# Patient Record
Sex: Female | Born: 1965 | Race: White | Hispanic: No | Marital: Married | Smoking: Never smoker
Health system: Southern US, Community
[De-identification: ages and names within clinical notes are randomized; demographics above are authoritative.]

## PROBLEM LIST (undated history)

## (undated) DIAGNOSIS — C801 Malignant (primary) neoplasm, unspecified: Secondary | ICD-10-CM

## (undated) DIAGNOSIS — F32A Depression, unspecified: Secondary | ICD-10-CM

---

## 1998-06-16 ENCOUNTER — Other Ambulatory Visit: Admission: RE | Admit: 1998-06-16 | Discharge: 1998-06-16 | Payer: Self-pay | Admitting: Obstetrics and Gynecology

## 1999-06-18 ENCOUNTER — Other Ambulatory Visit: Admission: RE | Admit: 1999-06-18 | Discharge: 1999-06-18 | Payer: Self-pay | Admitting: Obstetrics and Gynecology

## 2000-07-12 ENCOUNTER — Other Ambulatory Visit: Admission: RE | Admit: 2000-07-12 | Discharge: 2000-07-12 | Payer: Self-pay | Admitting: Obstetrics and Gynecology

## 2001-12-21 ENCOUNTER — Other Ambulatory Visit: Admission: RE | Admit: 2001-12-21 | Discharge: 2001-12-21 | Payer: Self-pay | Admitting: Obstetrics and Gynecology

## 2014-05-27 ENCOUNTER — Other Ambulatory Visit: Payer: Self-pay | Admitting: Obstetrics and Gynecology

## 2014-05-27 DIAGNOSIS — R921 Mammographic calcification found on diagnostic imaging of breast: Secondary | ICD-10-CM

## 2014-05-29 ENCOUNTER — Encounter (INDEPENDENT_AMBULATORY_CARE_PROVIDER_SITE_OTHER): Payer: Self-pay

## 2014-05-29 ENCOUNTER — Ambulatory Visit
Admission: RE | Admit: 2014-05-29 | Discharge: 2014-05-29 | Disposition: A | Discharge status: Home / Self Care | Payer: PRIVATE HEALTH INSURANCE | Source: Ambulatory Visit | Attending: Obstetrics and Gynecology | Admitting: Obstetrics and Gynecology

## 2014-05-29 ENCOUNTER — Other Ambulatory Visit: Payer: Self-pay | Admitting: Obstetrics and Gynecology

## 2014-05-29 DIAGNOSIS — R921 Mammographic calcification found on diagnostic imaging of breast: Secondary | ICD-10-CM

## 2014-06-04 ENCOUNTER — Ambulatory Visit
Admission: RE | Admit: 2014-06-04 | Discharge: 2014-06-04 | Disposition: A | Discharge status: Home / Self Care | Payer: PRIVATE HEALTH INSURANCE | Source: Ambulatory Visit | Attending: Obstetrics and Gynecology | Admitting: Obstetrics and Gynecology

## 2014-06-04 DIAGNOSIS — R921 Mammographic calcification found on diagnostic imaging of breast: Secondary | ICD-10-CM

## 2015-12-02 ENCOUNTER — Other Ambulatory Visit: Payer: Self-pay | Admitting: Obstetrics and Gynecology

## 2015-12-02 DIAGNOSIS — R928 Other abnormal and inconclusive findings on diagnostic imaging of breast: Secondary | ICD-10-CM

## 2015-12-23 ENCOUNTER — Ambulatory Visit
Admission: RE | Admit: 2015-12-23 | Discharge: 2015-12-23 | Disposition: A | Discharge status: Home / Self Care | Payer: PRIVATE HEALTH INSURANCE | Source: Ambulatory Visit | Attending: Obstetrics and Gynecology | Admitting: Obstetrics and Gynecology

## 2015-12-23 ENCOUNTER — Other Ambulatory Visit: Payer: Self-pay | Admitting: Obstetrics and Gynecology

## 2015-12-23 DIAGNOSIS — R928 Other abnormal and inconclusive findings on diagnostic imaging of breast: Secondary | ICD-10-CM

## 2016-04-22 ENCOUNTER — Other Ambulatory Visit: Payer: Self-pay | Admitting: Nurse Practitioner

## 2016-04-22 DIAGNOSIS — N6310 Unspecified lump in the right breast, unspecified quadrant: Secondary | ICD-10-CM

## 2016-04-22 DIAGNOSIS — N6489 Other specified disorders of breast: Secondary | ICD-10-CM

## 2016-06-28 ENCOUNTER — Other Ambulatory Visit: Payer: PRIVATE HEALTH INSURANCE

## 2019-07-15 ENCOUNTER — Ambulatory Visit: Payer: Self-pay | Admitting: Obstetrics and Gynecology

## 2019-09-04 ENCOUNTER — Ambulatory Visit: Payer: Self-pay | Admitting: Obstetrics and Gynecology

## 2019-10-02 ENCOUNTER — Ambulatory Visit: Payer: Self-pay | Admitting: Obstetrics and Gynecology

## 2020-10-16 ENCOUNTER — Other Ambulatory Visit: Payer: Self-pay | Admitting: Family Medicine

## 2020-10-16 DIAGNOSIS — R9389 Abnormal findings on diagnostic imaging of other specified body structures: Secondary | ICD-10-CM

## 2020-10-19 ENCOUNTER — Other Ambulatory Visit: Payer: Self-pay

## 2020-10-19 ENCOUNTER — Ambulatory Visit
Admission: RE | Admit: 2020-10-19 | Discharge: 2020-10-19 | Disposition: A | Discharge status: Home / Self Care | Payer: PRIVATE HEALTH INSURANCE | Source: Ambulatory Visit | Attending: Family Medicine | Admitting: Family Medicine

## 2020-10-19 ENCOUNTER — Ambulatory Visit
Admission: RE | Admit: 2020-10-19 | Discharge: 2020-10-19 | Disposition: A | Discharge status: Home / Self Care | Payer: Commercial Managed Care - PPO | Source: Ambulatory Visit | Attending: Family Medicine | Admitting: Family Medicine

## 2020-10-19 ENCOUNTER — Ambulatory Visit: Admission: RE | Admit: 2020-10-19 | Payer: PRIVATE HEALTH INSURANCE | Source: Ambulatory Visit

## 2020-10-19 ENCOUNTER — Other Ambulatory Visit: Payer: Self-pay | Admitting: Family Medicine

## 2020-10-19 DIAGNOSIS — R9389 Abnormal findings on diagnostic imaging of other specified body structures: Secondary | ICD-10-CM

## 2020-10-19 MED ORDER — GADOBUTROL 1 MMOL/ML IV SOLN
5.0000 mL | Freq: Once | INTRAVENOUS | Status: AC | PRN
  Administered 2020-10-19: 5 mL via INTRAVENOUS

## 2020-10-26 ENCOUNTER — Other Ambulatory Visit: Payer: Self-pay

## 2020-10-26 ENCOUNTER — Ambulatory Visit: Payer: Commercial Managed Care - PPO | Attending: General Surgery

## 2020-10-26 DIAGNOSIS — R293 Abnormal posture: Secondary | ICD-10-CM | POA: Insufficient documentation

## 2020-10-26 DIAGNOSIS — D0512 Intraductal carcinoma in situ of left breast: Secondary | ICD-10-CM | POA: Insufficient documentation

## 2020-10-26 NOTE — Therapy (Signed)
[place], [place], [address] Phone: [phone]   Fax:  [phone]  Physical Therapy Evaluation  Patient Details  Name: Danielle Camacho MRN: [id number] Date of Birth: [birth year] Referring Provider (PT): Dr. Donne Hazel   Encounter Date: 10/26/2020   PT End of Session - 10/26/20 1715    Visit Number 1    Number of Visits 2    Date for PT Re-Evaluation 12/21/20    PT Start Time 0406    PT Stop Time 0453    PT Time Calculation (min) 47 min           No past medical history on file.   There were no vitals filed for this visit.    Subjective Assessment - 10/26/20 1708    Subjective Pt is here for pre-surgical screening.  She will see the plastic surgeon on Thursday and after that a surgical date will be set.  If she is told she needs a left mastectomy she will opt to have a bilateral mastectomy. She does not know yet if she will require radiation.    Patient is accompained by: Family member    Pertinent History No pertinent information in chart yet.  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 No/denies    Multiple Pain Sites No              [facility] PT Assessment - 10/26/20 0001      Assessment   Medical Diagnosis Intraductal Carcinoma in situ left breast    Referring Provider (PT) Dr. Donne Hazel    Onset Date/Surgical Date --   not known yet   Hand Dominance Right    Prior Therapy yes at [facility]      Precautions   Precaution Comments lymphedema risk      Restrictions   Weight Bearing Restrictions No      Balance Screen   Has the patient fallen in the past 6 months No    Has the patient had a decrease in activity level because of a fear of falling?  No    Is the patient reluctant to leave their home because of a fear of  falling?  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      Cognition   Overall Cognitive Status Within Functional Limits for tasks assessed      Observation/Other Assessments   Skin Integrity WNL      Sensation   Light Touch Appears Intact      Coordination   Gross Motor Movements are Fluid and Coordinated Yes    Fine Motor Movements are Fluid and Coordinated Yes      Posture/Postural Control   Posture/Postural Control Postural limitations    Postural Limitations Rounded Shoulders;Forward head      AROM   Right Shoulder Extension 76 Degrees    Right Shoulder Flexion 161 Degrees    Right Shoulder ABduction 180 Degrees    Right Shoulder Internal Rotation 74 Degrees  Right Shoulder External Rotation 102 Degrees    Left Shoulder Extension 70 Degrees    Left Shoulder Flexion 159 Degrees    Left Shoulder ABduction 180 Degrees    Left Shoulder Internal Rotation 90 Degrees    Left Shoulder External Rotation 100 Degrees             LYMPHEDEMA/ONCOLOGY QUESTIONNAIRE - 10/26/20 0001      Type   Cancer Type Left intraductal carcinoma in situ      Treatment   Active Chemotherapy Treatment No    Past Chemotherapy Treatment No    Active Radiation Treatment No    Past Radiation Treatment No    Current Hormone Treatment No    Past Hormone Therapy No      What other symptoms do you have   Are you having Pain No    Are you having pitting edema No    Is it Hard or Difficult finding clothes that fit No    Do you have infections No    Is there Decreased scar mobility No    Stemmer Sign No      Right Upper Extremity Lymphedema   15 cm Proximal to Olecranon Process 26.1 cm    10 cm Proximal to  Olecranon Process 26.2 cm    Olecranon Process 22.9 cm    15 cm Proximal to Ulnar Styloid Process 22.5 cm    10 cm Proximal to Ulnar Styloid Process 19 cm    Just Proximal to Ulnar Styloid Process 14.3 cm    Across Hand at [employer] 19.7 cm    At Force of 2nd Digit 5.5 cm      Left Upper Extremity Lymphedema   15 cm Proximal to Olecranon Process 25.5 cm    10 cm Proximal to Olecranon Process 25.3 cm    Olecranon Process 23 cm    15 cm Proximal to Ulnar Styloid Process 21.3 cm    10 cm Proximal to Ulnar Styloid Process 18.4 cm    Just Proximal to Ulnar Styloid Process 14.5 cm    Across Hand at [employer] 18 cm    At [place] of 2nd Digit 5.4 cm           L-DEX FLOWSHEETS - 10/26/20 1700      L-DEX LYMPHEDEMA SCREENING   Measurement Type Unilateral    L-DEX MEASUREMENT EXTREMITY Upper Extremity    POSITION  Standing    DOMINANT SIDE Right    At Risk Side Left    BASELINE SCORE (UNILATERAL) -1.6                Quick Dash - 10/26/20 0001    Open a tight or new jar No difficulty    Do heavy household chores (wash walls, wash floors) No difficulty    Carry a shopping bag or briefcase No difficulty    Wash your back No difficulty    Use a knife to cut food No difficulty    Recreational activities in which you take some force or impact through your arm, shoulder, or hand (golf, hammering, tennis) No difficulty    During the past week, to what extent has your arm, shoulder or hand problem interfered with your normal social activities with family, friends, neighbors, or groups? Not at all    During the past week, to what extent has your arm, shoulder or hand problem limited your work or other regular daily activities Not at all  Arm, shoulder, or hand pain. None    Tingling (pins and needles) in your arm, shoulder, or hand None    Difficulty Sleeping No difficulty    DASH Score 0 %            Objective measurements completed on examination: See above findings.                PT Education - 10/26/20 1713    Education Details Pt was educated in 4 post op exercises to initiate after drains are removed or as allowed by MD.  She was also instructed in free ABC class, and in continuing SOZO every 3 months after her surgery date for the next 2 years.  We discussed precautions for lymphedema/skin care.    Person(s) Educated Patient;Spouse    Methods Explanation;Demonstration;Handout    Comprehension Verbalized understanding;Returned demonstration               PT Long Term Goals - 10/26/20 1721      PT LONG TERM GOAL #1   Title Pts post surgical shoulder ROM will return to pre-surgical ROM and function    Time 8    Period Weeks    Status New    Target Date 12/21/20           Breast Clinic Goals - 10/26/20 1721      Patient will be able to verbalize understanding of pertinent lymphedema risk reduction practices relevant to her diagnosis specifically related to skin care.   Time 1    Period Days    Status Achieved    Target Date 10/26/20      Patient will be able to return demonstrate and/or verbalize understanding of the post-op home exercise program related to regaining shoulder range of motion.   Time 1    Period Days    Status Achieved    Target Date 10/26/20      Patient will be able to verbalize understanding of the importance of attending the postoperative After Breast Cancer Class for further lymphedema risk reduction education and therapeutic exercise.   Time 1    Period Days    Status Achieved    Target Date 10/26/20                 Plan - 10/26/20 1716    Clinical Impression Statement Pt. was seen today for pre-surgical screening.  Shoulder ROM, circumferential measurements and SOZO screen was performed.  Pt was given information on the ABC class and lymphedema.  She was advised to call when she gets her surgical date so we can arrange for follow up.    Stability/Clinical Decision Making  Stable/Uncomplicated    Clinical Decision Making Low    Rehab Potential Excellent    PT Frequency 1x / week    PT Duration 8 weeks    PT Next Visit Plan Reassessment after surgery to determine further physical therapy needs    PT Home Exercise Plan 4 post op exercises after drains removed and approval of MD    Recommended Other Services ABC class, SOZO screeens every 3 months after surgery for 2 years    Consulted and Agree with Plan of Care Patient;Family member/caregiver    Family Member Consulted husband         Patient will follow up at outpatient cancer rehab 3-4 weeks following surgery.  If the patient requires physical therapy at that time, a specific plan will be dictated and sent to the referring physician for  approval. The patient was educated today on appropriate basic range of motion exercises to begin post operatively and the importance of attending the After Breast Cancer class following surgery.  Patient was educated today on lymphedema risk reduction practices as it pertains to recommendations that will benefit the patient immediately following surgery.  She verbalized good understanding.   The patient was assessed using the L-Dex machine today to produce a lymphedema index baseline score. The patient will be reassessed on a regular basis (typically every 3 months) to obtain new L-Dex scores. If the score is > 6.5 points away from his/her baseline score indicating onset of subclinical lymphedema, it will be recommended to wear a compression garment for 4 weeks, 12 hours per day and then be reassessed. If the score continues to be > 6.5 points from baseline at reassessment, we will initiate lymphedema treatment. Assessing in this manner has a 95% rate of preventing clinically significant lymphedema.   Patient will benefit from skilled therapeutic intervention in order to improve the following deficits and impairments:  Decreased knowledge of precautions  Visit  Diagnosis: Intraductal carcinoma in situ of left breast  Abnormal posture     Problem List There are no problems to display for this patient.   Claris Pong 10/26/2020, 5:25 PM  [place], [place], [address] Phone: [phone]   Fax:  [phone]  Name: Rosalie Buenaventura MRN: [id number] Date of Birth: 1966-03-26 Cheral Almas, PT 10/26/20 5:29 PM

## 2020-10-26 NOTE — Patient Instructions (Signed)
Patient was instructed today in a home exercise program today for post op shoulder range of motion. These included active assist shoulder flexion in sitting/supine, scapular retraction, wall walking with shoulder abduction, and hands behind head external rotation in sitting/supine.  She was encouraged to do these twice a day, holding 3 seconds and repeating 5 times when permitted by her physician and after drains are removed.  Physical Therapy Information for After Breast Cancer Surgery/Treatment:   Lymphedema is a swelling condition that you may be at risk for in your arm if you have lymph nodes removed from the armpit area.  After a sentinel node biopsy, the risk is approximately 5-9% and is higher after an axillary node dissection.  There is treatment available for this condition and it is not life-threatening.  Contact your physician or physical therapist with concerns.  You may begin the 4 shoulder/posture exercises (see additional sheet) when permitted by your physician (typically a week after surgery).  If you have drains, you may need to wait until those are removed before beginning range of motion exercises.  A general recommendation is to not lift your arms above shoulder height until drains are removed.  These exercises should be done to your tolerance and gently.  This is not a "no pain/no gain" type of recovery so listen to your body and stretch into the range of motion that you can tolerate, stopping if you have pain.  If you are having immediate reconstruction, ask your plastic surgeon about doing exercises as he or she may want you to wait.  We encourage you to attend the free one time ABC (After Breast Cancer) class offered by [facility].  You will learn information related to lymphedema risk, prevention and treatment and additional exercises to regain mobility following surgery.  You can call [phone] for more information.  This is offered the 1st and 3rd  Monday of each month.  You only attend the class one time.  While undergoing any medical procedure or treatment, try to avoid blood pressure being taken or needle sticks from occurring on the arm on the side of cancer.   This recommendation begins after surgery and continues for the rest of your life.  This may help reduce your risk of getting lymphedema (swelling in your arm).  An excellent resource for those seeking information on lymphedema is the National Lymphedema Network's web site. It can be accessed at [place].org  If you notice swelling in your hand, arm or breast at any time following surgery (even if it is many years from now), please contact your doctor or physical therapist to discuss this.  Lymphedema can be treated at any time but it is easier for you if it is treated early on.  If you feel like your shoulder motion is not returning to normal in a reasonable amount of time, please contact your surgeon or physical therapist.  [facility]. [place], [place], [place] [phone]; [address] [place], [place] [address] ABC CLASS After Breast Cancer Class  After Breast Cancer Class is a specially designed exercise class to assist you in a safe recover after having breast cancer surgery.  In this class you will learn how to get back to full function whether your drains were just removed or if you had surgery a month ago.  This one-time class is held the 1st and 3rd Monday of every month from 11:00 a.m. until 12:00 noon at the [facility] located at [phone]  [place], [place] [address]  This class is FREE and space is limited. For more information or to register for the next available class, call [phone].  Class Goals   Understand specific stretches to improve the flexibility of you chest and shoulder.  Learn ways to safely strengthen your upper body and improve your posture.  Understand the warning signs of infection and why you may be at risk  for an arm infection.  Learn about Lymphedema and prevention.  ** You do not attend this class until after surgery.  Drains must be removed to participate  Patient was instructed today in a home exercise program today for post op shoulder range of motion. These included active assist shoulder flexion in sitting, scapular retraction, wall walking with shoulder abduction, and hands behind head external rotation.  She was encouraged to do these twice a day, holding 3 seconds and repeating 5 times when permitted by her physician.

## 2020-10-30 ENCOUNTER — Other Ambulatory Visit: Payer: Self-pay | Admitting: General Surgery

## 2020-10-30 DIAGNOSIS — D0512 Intraductal carcinoma in situ of left breast: Secondary | ICD-10-CM

## 2020-11-06 NOTE — H&P (Signed)
Subjective:     Patient ID: Danielle Camacho is a 55 y.o. female.  HPI  Here for follow up discussion breast reconstruction prior to planned bilateral mastectomies. Presented following screening MMG with left breast calcifications. Biopsy demonstrated high grade DCIS ER+/PR-. Calcifications span 7.9 cm. Axillary [place] negaitve  MRI RIGHT breast showed no abnormal LN, three areas of circumferential ring like enhancement within the RIGHT breast compatible with benign cystic changes.   Skin sparing mastectomy recommended on left. Patient desires bilateral mastectomies.  Genetics pending.  Current 34B. Wt stable   Works as Therapist, occupational in [place]. 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 Has two daughters.  Review of Systems     Objective:   Physical Exam Cardiovascular:     Rate and Rhythm: Normal rate and regular rhythm.     Heart sounds: Normal heart sounds.  Pulmonary:     Effort: Pulmonary effort is normal.     Breath sounds: Normal breath sounds.  Abdominal:     Comments: Minimal soft tissue for reconstruction  Skin:    Comments: Fitzpatrick 2     Breasts: no masses palpable, right>left volume Right grade 3 ptosis, left pseudoptosis SN to nipple R 23 L 21 cm BW R 11 L 14 cm Nipple to IMF R 7 L 5 cm     Assessment:     DCIS left breast    Plan:     Plan bilateral skin sparing mastectomies with immediate expander acellular dermis reconstruction.  Reviewedreconstructionwill be asensate and not stimulate. Reviewed risks mastectomy flap necrosis requiring additional surgery. She is not candidate for NSM over left, patient declines NSM over right. Discussed possible anchor type scar vs transverse chest scar.  Discussed use of acellular dermis in reconstruction, cadaveric source, incorporation over several weeks, risk that if has seroma or infection can act as additional nidus for infection if not incorporated.Reviewed this is an off label use of acellular  dermis.  Reviewedprepectoral vs sub pectoral reconstruction. Discussed with patient and benefit of this is no animation deformity, may be less pain. Risk may be more visible rippling over upper poles, greater need of ADM. Reviewed pre pectoral would require larger amount acellular dermis, more drains. Discussed any type reconstruction also risks long term displacement implant and visible rippling. If prepectoral counseled I would recommend she be comfortable with silicone implants as more options that have less rippling. Sheagrees to prepectoral placement.  Additional risks including but not limited to bleeding, seroma, hematoma, damage to adjacent structures, need for additional procedures, blood clots in legs or lungs reviewed.  Reviewed overnight stay, drains, dressings, and post op limitations  Discussed risk COVID infectionthrough this elective surgery. Patient will receive COVID testing prior to surgery. Discussed even if patient receivesa negative test result, the tests in some cases may fail to detect the virus or patient maycontract COVID after the test.COVID 19 infectionbefore/during/aftersurgery may result in lead to a higher chance of complication and death.  Patient reported that she had desire for saline implants as there was less risk of infection with these. Counseled no difference in infection or contracture risks. Reviewed silicone more common in reconstruction setting as options available for more stable shape and less rippling over saline. However silicone does require imaging surveillance for rupture.  Rx for [place] given. Drain teaching completed. Rx for robaxin Bactrim and oxycodone given.

## 2020-11-23 ENCOUNTER — Other Ambulatory Visit (HOSPITAL_COMMUNITY): Payer: Commercial Managed Care - PPO

## 2020-11-23 NOTE — Pre-Procedure Instructions (Addendum)
Surgical Instructions    Your procedure is scheduled on Thursday, April 7th.  Report to [facility] Main Entrance "A" at 5:30 A.M., then check in with the Admitting office.  Call this number if you have problems the morning of surgery:  [phone]   If you have any questions prior to your surgery date call [phone]: Open Monday-Friday 8am-4pm    Remember:  Do not eat after midnight the night before your surgery  You may drink clear liquids until 4:30 the morning of your surgery.   Clear liquids allowed are: Water, Non-Citrus Juices (without pulp), Carbonated Beverages, Clear Tea, Black Coffee Only, and Gatorade.  Patient Instructions  . The night before surgery:  o No food after midnight. ONLY clear liquids after midnight   . The day of surgery (if you do NOT have diabetes):  o Drink ONE (1) Pre-Surgery Clear Ensure by 4:30 am the morning of surgery   o This drink was given to you during your hospital  pre-op appointment visit. o Nothing else to drink after completing the  Pre-Surgery Clear Ensure.         If you have questions, please contact your surgeon's office.     Take these medicines AS NEEDED the morning of surgery with A SIP OF WATER  LORazepam (ATIVAN)  valACYclovir (VALTREX)   As of today, STOP taking any Aspirin (unless otherwise instructed by your surgeon) Aleve, Naproxen, Ibuprofen, Motrin, Advil, Goody's, BC's, all herbal medications, fish oil, and all vitamins.                     Do NOT Smoke (Tobacco/Vaping) or drink Alcohol 24 hours prior to your procedure.  If you use a CPAP at night, you may bring all equipment for your overnight stay.   Contacts, glasses, piercing's, hearing aid's, dentures or partials may not be worn into surgery, please bring cases for these belongings.    For patients admitted to the hospital, discharge time will be determined by your treatment team.   Patients discharged the day of surgery will not be allowed to drive  home, and someone needs to stay with them for 24 hours.    Special instructions:   [facility]- Preparing For Surgery  Before surgery, you can play an important role. Because skin is not sterile, your skin needs to be as free of germs as possible. You can reduce the number of germs on your skin by washing with CHG (chlorahexidine gluconate) Soap before surgery.  CHG is an antiseptic cleaner which kills germs and bonds with the skin to continue killing germs even after washing.    Oral Hygiene is also important to reduce your risk of infection.  Remember - BRUSH YOUR TEETH THE MORNING OF SURGERY WITH YOUR REGULAR TOOTHPASTE  Please do not use if you have an allergy to CHG or antibacterial soaps. If your skin becomes reddened/irritated stop using the CHG.  Do not shave (including legs and underarms) for at least 48 hours prior to first CHG shower. It is OK to shave your face.  Please follow these instructions carefully.   1. Shower the NIGHT BEFORE SURGERY and the MORNING OF SURGERY  2. If you chose to wash your hair, wash your hair first as usual with your normal shampoo.  3. After you shampoo, rinse your hair and body thoroughly to remove the shampoo.  4. Use CHG Soap as you would any other liquid soap. You can apply CHG directly to the skin  and wash gently with a scrungie or a clean washcloth.   5. Apply the CHG Soap to your body ONLY FROM THE NECK DOWN.  Do not use on open wounds or open sores. Avoid contact with your eyes, ears, mouth and genitals (private parts). Wash Face and genitals (private parts)  with your normal soap.   6. Wash thoroughly, paying special attention to the area where your surgery will be performed.  7. Thoroughly rinse your body with warm water from the neck down.  8. DO NOT shower/wash with your normal soap after using and rinsing off the CHG Soap.  9. Pat yourself dry with a CLEAN TOWEL.  10. Wear CLEAN PAJAMAS to bed the night before surgery  11. Place  CLEAN SHEETS on your bed the night before your surgery  12. DO NOT SLEEP WITH PETS.   Day of Surgery: Shower with CHG soap. Do not wear jewelry, make up, or nail polish Do not wear lotions, powders, perfumes, or deodorant. Do not shave 48 hours prior to surgery.  Do not bring valuables to the hospital. [facility] is not responsible for any belongings or valuables. Wear Clean/Comfortable clothing the morning of surgery Remember to brush your teeth WITH YOUR REGULAR TOOTHPASTE.   Please read over the following fact sheets that you were given.

## 2020-11-24 ENCOUNTER — Other Ambulatory Visit: Payer: Self-pay

## 2020-11-24 ENCOUNTER — Encounter (HOSPITAL_COMMUNITY): Payer: Self-pay

## 2020-11-24 ENCOUNTER — Encounter (HOSPITAL_COMMUNITY)
Admission: RE | Admit: 2020-11-24 | Discharge: 2020-11-24 | Disposition: A | Discharge status: Home / Self Care | Payer: Commercial Managed Care - PPO | Source: Ambulatory Visit | Attending: General Surgery | Admitting: General Surgery

## 2020-11-24 DIAGNOSIS — Z01812 Encounter for preprocedural laboratory examination: Secondary | ICD-10-CM | POA: Diagnosis present

## 2020-11-24 DIAGNOSIS — Z20822 Contact with and (suspected) exposure to covid-19: Secondary | ICD-10-CM | POA: Diagnosis not present

## 2020-11-24 DIAGNOSIS — D0512 Intraductal carcinoma in situ of left breast: Secondary | ICD-10-CM | POA: Diagnosis not present

## 2020-11-24 HISTORY — DX: Depression, unspecified: F32.A

## 2020-11-24 HISTORY — DX: Malignant (primary) neoplasm, unspecified: C80.1

## 2020-11-24 LAB — CBC
HCT: 42.8 % (ref 36.0–46.0)
Hemoglobin: 14.4 g/dL (ref 12.0–15.0)
MCH: 31.2 pg (ref 26.0–34.0)
MCHC: 33.6 g/dL (ref 30.0–36.0)
MCV: 92.6 fL (ref 80.0–100.0)
Platelets: 144 10*3/uL — ABNORMAL LOW (ref 150–400)
RBC: 4.62 MIL/uL (ref 3.87–5.11)
RDW: 12 % (ref 11.5–15.5)
WBC: 4.4 10*3/uL (ref 4.0–10.5)
nRBC: 0 % (ref 0.0–0.2)

## 2020-11-24 LAB — SARS CORONAVIRUS 2 (TAT 6-24 HRS): SARS Coronavirus 2: NEGATIVE

## 2020-11-24 NOTE — Progress Notes (Signed)
PCP - Dr. Kennith Maes Cardiologist - denies  Chest x-ray - n/a EKG - n/a Stress Test - denies ECHO - denies Cardiac Cath - denies  Sleep Study - denies CPAP - denies  Blood Thinner Instructions: n/a Aspirin Instructions:n/a  ERAS Protcol - pt to stop clear liquids by 0430 DOS. PRE-SURGERY Ensure or G2- Pt given (1) pre-surgical ensure to complete by 0430 DOS.  COVID TEST- 11/24/20; pt aware to quarantine after testing.  Requested clarification on consent with Dr. Cristal Generous office. Pt states that it should be bilateral skin sparing mastectomy and not just left skin sparing. Awaiting clarification from surgeon.   Anesthesia review: No  Patient denies shortness of breath, fever, cough and chest pain at PAT appointment   All instructions explained to the patient, with a verbal understanding of the material. Patient agrees to go over the instructions while at home for a better understanding. Patient also instructed to self quarantine after being tested for COVID-19. The opportunity to ask questions was provided.

## 2020-11-26 ENCOUNTER — Ambulatory Visit (HOSPITAL_COMMUNITY): Payer: Commercial Managed Care - PPO

## 2020-11-26 ENCOUNTER — Encounter (HOSPITAL_COMMUNITY)
Admission: RE | Admit: 2020-11-26 | Discharge: 2020-11-26 | Disposition: A | Discharge status: Home / Self Care | Payer: Commercial Managed Care - PPO | Source: Ambulatory Visit | Attending: General Surgery | Admitting: General Surgery

## 2020-11-26 ENCOUNTER — Encounter (HOSPITAL_COMMUNITY)
Admission: RE | Disposition: A | Discharge status: Home / Self Care | Payer: Self-pay | Source: Home / Self Care | Attending: General Surgery

## 2020-11-26 ENCOUNTER — Observation Stay (HOSPITAL_BASED_OUTPATIENT_CLINIC_OR_DEPARTMENT_OTHER)
Admission: RE | Admit: 2020-11-26 | Discharge: 2020-11-27 | Disposition: A | Discharge status: Home / Self Care | Payer: Commercial Managed Care - PPO | Attending: General Surgery | Admitting: General Surgery

## 2020-11-26 ENCOUNTER — Encounter (HOSPITAL_COMMUNITY): Payer: Self-pay | Admitting: General Surgery

## 2020-11-26 ENCOUNTER — Other Ambulatory Visit: Payer: Self-pay

## 2020-11-26 DIAGNOSIS — N6031 Fibrosclerosis of right breast: Secondary | ICD-10-CM | POA: Insufficient documentation

## 2020-11-26 DIAGNOSIS — D0512 Intraductal carcinoma in situ of left breast: Secondary | ICD-10-CM | POA: Diagnosis not present

## 2020-11-26 DIAGNOSIS — Z1231 Encounter for screening mammogram for malignant neoplasm of breast: Secondary | ICD-10-CM | POA: Diagnosis present

## 2020-11-26 DIAGNOSIS — C50911 Malignant neoplasm of unspecified site of right female breast: Secondary | ICD-10-CM | POA: Diagnosis present

## 2020-11-26 DIAGNOSIS — Z87891 Personal history of nicotine dependence: Secondary | ICD-10-CM | POA: Insufficient documentation

## 2020-11-26 HISTORY — PX: BREAST RECONSTRUCTION WITH PLACEMENT OF TISSUE EXPANDER AND ALLODERM: SHX6805

## 2020-11-26 HISTORY — PX: TOTAL MASTECTOMY: SHX6129

## 2020-11-26 HISTORY — PX: MASTECTOMY W/ SENTINEL NODE BIOPSY: SHX2001

## 2020-11-26 SURGERY — MASTECTOMY WITH SENTINEL LYMPH NODE BIOPSY
Anesthesia: General | Site: Breast | Laterality: Right

## 2020-11-26 MED ORDER — ORAL CARE MOUTH RINSE: 15.0000 mL | Freq: Once | OROMUCOSAL | Status: AC

## 2020-11-26 MED ORDER — PROPOFOL 10 MG/ML IV BOLUS
INTRAVENOUS | Status: AC
  Filled 2020-11-26: qty 40

## 2020-11-26 MED ORDER — ONDANSETRON HCL 4 MG/2ML IJ SOLN: 4.0000 mg | Freq: Four times a day (QID) | INTRAMUSCULAR | Status: DC | PRN

## 2020-11-26 MED ORDER — BUPIVACAINE HCL (PF) 0.25 % IJ SOLN
INTRAMUSCULAR | Status: AC
  Filled 2020-11-26: qty 30

## 2020-11-26 MED ORDER — MIDAZOLAM HCL 2 MG/2ML IJ SOLN
INTRAMUSCULAR | Status: AC
  Filled 2020-11-26: qty 2

## 2020-11-26 MED ORDER — PHENYLEPHRINE HCL-NACL 10-0.9 MG/250ML-% IV SOLN
INTRAVENOUS | Status: DC | PRN
  Administered 2020-11-26: 40 ug/min via INTRAVENOUS

## 2020-11-26 MED ORDER — ENSURE PRE-SURGERY PO LIQD: 296.0000 mL | Freq: Once | ORAL | Status: DC

## 2020-11-26 MED ORDER — 0.9 % SODIUM CHLORIDE (POUR BTL) OPTIME
TOPICAL | Status: DC | PRN
  Administered 2020-11-26: 2000 mL

## 2020-11-26 MED ORDER — LIDOCAINE 2% (20 MG/ML) 5 ML SYRINGE
INTRAMUSCULAR | Status: DC | PRN
  Administered 2020-11-26: 40 mg via INTRAVENOUS

## 2020-11-26 MED ORDER — KETOROLAC TROMETHAMINE 15 MG/ML IJ SOLN
15.0000 mg | INTRAMUSCULAR | Status: AC
  Administered 2020-11-26: 15 mg via INTRAVENOUS
  Filled 2020-11-26: qty 1

## 2020-11-26 MED ORDER — FENTANYL CITRATE (PF) 100 MCG/2ML IJ SOLN: 25.0000 ug | INTRAMUSCULAR | Status: DC | PRN

## 2020-11-26 MED ORDER — CEFAZOLIN SODIUM-DEXTROSE 2-4 GM/100ML-% IV SOLN
2.0000 g | Freq: Three times a day (TID) | INTRAVENOUS | Status: DC
  Administered 2020-11-26 – 2020-11-27 (×2): 2 g via INTRAVENOUS
  Filled 2020-11-26 (×3): qty 100

## 2020-11-26 MED ORDER — SUGAMMADEX SODIUM 200 MG/2ML IV SOLN
INTRAVENOUS | Status: DC | PRN
  Administered 2020-11-26: 200 mg via INTRAVENOUS

## 2020-11-26 MED ORDER — GLYCOPYRROLATE 0.2 MG/ML IJ SOLN
INTRAMUSCULAR | Status: DC | PRN
  Administered 2020-11-26 (×2): .1 mg via INTRAVENOUS

## 2020-11-26 MED ORDER — TECHNETIUM TC 99M TILMANOCEPT KIT
1.0000 | PACK | Freq: Once | INTRAVENOUS | Status: AC | PRN
  Administered 2020-11-26: 1 via INTRADERMAL

## 2020-11-26 MED ORDER — DEXAMETHASONE SODIUM PHOSPHATE 10 MG/ML IJ SOLN
INTRAMUSCULAR | Status: DC | PRN
  Administered 2020-11-26: 10 mg via INTRAVENOUS

## 2020-11-26 MED ORDER — SODIUM CHLORIDE 0.9 % IV SOLN
INTRAVENOUS | Status: DC | PRN
  Administered 2020-11-26: 1000 mL

## 2020-11-26 MED ORDER — SODIUM CHLORIDE (PF) 0.9 % IJ SOLN
INTRAMUSCULAR | Status: AC
  Filled 2020-11-26: qty 10

## 2020-11-26 MED ORDER — OXYCODONE HCL 5 MG/5ML PO SOLN: 5.0000 mg | Freq: Once | ORAL | Status: DC | PRN

## 2020-11-26 MED ORDER — METHOCARBAMOL 500 MG PO TABS
500.0000 mg | ORAL_TABLET | Freq: Four times a day (QID) | ORAL | Status: DC | PRN
  Administered 2020-11-26 – 2020-11-27 (×2): 500 mg via ORAL
  Filled 2020-11-26 (×2): qty 1

## 2020-11-26 MED ORDER — SODIUM CHLORIDE 0.9 % IV SOLN
INTRAVENOUS | Status: DC
  Filled 2020-11-26: qty 10

## 2020-11-26 MED ORDER — ONDANSETRON HCL 4 MG/2ML IJ SOLN
INTRAMUSCULAR | Status: DC | PRN
  Administered 2020-11-26: 4 mg via INTRAVENOUS

## 2020-11-26 MED ORDER — ESCITALOPRAM OXALATE 10 MG PO TABS
5.0000 mg | ORAL_TABLET | Freq: Every day | ORAL | Status: DC
  Administered 2020-11-26: 5 mg via ORAL
  Filled 2020-11-26: qty 1

## 2020-11-26 MED ORDER — LACTATED RINGERS IV SOLN: INTRAVENOUS | Status: DC

## 2020-11-26 MED ORDER — OXYCODONE HCL 5 MG PO TABS: 5.0000 mg | ORAL_TABLET | Freq: Once | ORAL | Status: DC | PRN

## 2020-11-26 MED ORDER — CHLORHEXIDINE GLUCONATE 0.12 % MT SOLN
15.0000 mL | Freq: Once | OROMUCOSAL | Status: AC
  Administered 2020-11-26: 15 mL via OROMUCOSAL
  Filled 2020-11-26: qty 15

## 2020-11-26 MED ORDER — FENTANYL CITRATE (PF) 250 MCG/5ML IJ SOLN
INTRAMUSCULAR | Status: AC
  Filled 2020-11-26: qty 5

## 2020-11-26 MED ORDER — ACETAMINOPHEN 500 MG PO TABS
1000.0000 mg | ORAL_TABLET | Freq: Four times a day (QID) | ORAL | Status: DC
  Administered 2020-11-26 – 2020-11-27 (×4): 1000 mg via ORAL
  Filled 2020-11-26 (×4): qty 2

## 2020-11-26 MED ORDER — ONDANSETRON HCL 4 MG/2ML IJ SOLN: 4.0000 mg | Freq: Once | INTRAMUSCULAR | Status: DC | PRN

## 2020-11-26 MED ORDER — OXYCODONE HCL 5 MG PO TABS
5.0000 mg | ORAL_TABLET | ORAL | Status: DC | PRN
Start: 2020-11-26 — End: 2020-11-27
  Administered 2020-11-26 – 2020-11-27 (×4): 10 mg via ORAL
  Filled 2020-11-26 (×4): qty 2

## 2020-11-26 MED ORDER — MIDAZOLAM HCL 5 MG/5ML IJ SOLN
INTRAMUSCULAR | Status: DC | PRN
  Administered 2020-11-26: 2 mg via INTRAVENOUS

## 2020-11-26 MED ORDER — SIMETHICONE 80 MG PO CHEW: 40.0000 mg | CHEWABLE_TABLET | Freq: Four times a day (QID) | ORAL | Status: DC | PRN

## 2020-11-26 MED ORDER — MORPHINE SULFATE (PF) 2 MG/ML IV SOLN
1.0000 mg | INTRAVENOUS | Status: DC | PRN
  Administered 2020-11-26: 1 mg via INTRAVENOUS
  Filled 2020-11-26: qty 1

## 2020-11-26 MED ORDER — DIPHENHYDRAMINE HCL 50 MG/ML IJ SOLN
INTRAMUSCULAR | Status: DC | PRN
  Administered 2020-11-26: 12.5 mg via INTRAVENOUS

## 2020-11-26 MED ORDER — PROPOFOL 10 MG/ML IV BOLUS
INTRAVENOUS | Status: DC | PRN
  Administered 2020-11-26: 150 mg via INTRAVENOUS

## 2020-11-26 MED ORDER — FENTANYL CITRATE (PF) 250 MCG/5ML IJ SOLN
INTRAMUSCULAR | Status: DC | PRN
  Administered 2020-11-26: 100 ug via INTRAVENOUS
  Administered 2020-11-26: 50 ug via INTRAVENOUS

## 2020-11-26 MED ORDER — ONDANSETRON 4 MG PO TBDP: 4.0000 mg | ORAL_TABLET | Freq: Four times a day (QID) | ORAL | Status: DC | PRN

## 2020-11-26 MED ORDER — SODIUM CHLORIDE 0.9 % IV SOLN: INTRAVENOUS | Status: DC

## 2020-11-26 MED ORDER — ACETAMINOPHEN 500 MG PO TABS
1000.0000 mg | ORAL_TABLET | ORAL | Status: AC
  Administered 2020-11-26: 1000 mg via ORAL
  Filled 2020-11-26: qty 2

## 2020-11-26 MED ORDER — METHYLENE BLUE 0.5 % INJ SOLN
INTRAVENOUS | Status: AC
  Filled 2020-11-26: qty 10

## 2020-11-26 MED ORDER — CEFAZOLIN SODIUM-DEXTROSE 2-4 GM/100ML-% IV SOLN
2.0000 g | INTRAVENOUS | Status: AC
  Administered 2020-11-26: 2 g via INTRAVENOUS
  Filled 2020-11-26: qty 100

## 2020-11-26 MED ORDER — LORAZEPAM 0.5 MG PO TABS: 0.5000 mg | ORAL_TABLET | Freq: Three times a day (TID) | ORAL | Status: DC | PRN

## 2020-11-26 MED ORDER — HEMOSTATIC AGENTS (NO CHARGE) OPTIME
TOPICAL | Status: DC | PRN
  Administered 2020-11-26: 1 via TOPICAL

## 2020-11-26 MED ORDER — ROCURONIUM BROMIDE 10 MG/ML (PF) SYRINGE
PREFILLED_SYRINGE | INTRAVENOUS | Status: DC | PRN
  Administered 2020-11-26: 60 mg via INTRAVENOUS
  Administered 2020-11-26: 30 mg via INTRAVENOUS
  Administered 2020-11-26: 40 mg via INTRAVENOUS

## 2020-11-26 SURGICAL SUPPLY — 107 items
ADH SKN CLS APL DERMABOND .7 (GAUZE/BANDAGES/DRESSINGS) ×3
ALLOGRAFT PERF 16X20 1.6+/-0.4 (Tissue) ×2 IMPLANT
APL PRP STRL LF DISP 70% ISPRP (MISCELLANEOUS) ×6
APL SKNCLS STERI-STRIP NONHPOA (GAUZE/BANDAGES/DRESSINGS)
APPLIER CLIP 11 MED OPEN (CLIP) ×4
APPLIER CLIP 9.375 MED OPEN (MISCELLANEOUS)
APR CLP MED 11 20 MLT OPN (CLIP) ×3
APR CLP MED 9.3 20 MLT OPN (MISCELLANEOUS)
BAG DECANTER FOR FLEXI CONT (MISCELLANEOUS) ×5 IMPLANT
BENZOIN TINCTURE PRP APPL 2/3 (GAUZE/BANDAGES/DRESSINGS) IMPLANT
BINDER BREAST LRG (GAUZE/BANDAGES/DRESSINGS) ×1 IMPLANT
BINDER BREAST MEDIUM (GAUZE/BANDAGES/DRESSINGS) IMPLANT
BINDER BREAST XLRG (GAUZE/BANDAGES/DRESSINGS) IMPLANT
BINDER BREAST XXLRG (GAUZE/BANDAGES/DRESSINGS) IMPLANT
BIOPATCH RED 1 DISK 7.0 (GAUZE/BANDAGES/DRESSINGS) IMPLANT
BLADE CLIPPER SURG (BLADE) IMPLANT
BLADE HEX COATED 2.75 (ELECTRODE) IMPLANT
BLADE SURG 10 STRL SS (BLADE) ×4 IMPLANT
BLADE SURG 15 STRL LF DISP TIS (BLADE) ×3 IMPLANT
BLADE SURG 15 STRL SS (BLADE) ×4
CANISTER SUCT 1200ML W/VALVE (MISCELLANEOUS) ×4 IMPLANT
CANISTER SUCT 3000ML PPV (MISCELLANEOUS) ×4 IMPLANT
CATH FOLEY 2WAY SLVR  5CC 14FR (CATHETERS) ×4
CATH FOLEY 2WAY SLVR 5CC 14FR (CATHETERS) IMPLANT
CHLORAPREP W/TINT 26 (MISCELLANEOUS) ×8 IMPLANT
CLIP APPLIE 11 MED OPEN (CLIP) IMPLANT
CLIP APPLIE 9.375 MED OPEN (MISCELLANEOUS) IMPLANT
CLIP VESOCCLUDE SM WIDE 6/CT (CLIP) IMPLANT
COVER BACK TABLE 60X90IN (DRAPES) ×4 IMPLANT
COVER MAYO STAND STRL (DRAPES) ×4 IMPLANT
COVER PROBE W GEL 5X96 (DRAPES) ×4 IMPLANT
COVER SURGICAL LIGHT HANDLE (MISCELLANEOUS) ×4 IMPLANT
COVER WAND RF STERILE (DRAPES) ×3 IMPLANT
DECANTER SPIKE VIAL GLASS SM (MISCELLANEOUS) IMPLANT
DERMABOND ADVANCED (GAUZE/BANDAGES/DRESSINGS) ×1
DERMABOND ADVANCED .7 DNX12 (GAUZE/BANDAGES/DRESSINGS) ×9 IMPLANT
DRAIN CHANNEL 15F RND FF W/TCR (WOUND CARE) ×2 IMPLANT
DRAIN CHANNEL 19F RND (DRAIN) ×5 IMPLANT
DRAPE HALF SHEET 40X57 (DRAPES) ×2 IMPLANT
DRAPE INCISE IOBAN 66X45 STRL (DRAPES) ×1 IMPLANT
DRAPE ORTHO SPLIT 77X108 STRL (DRAPES) ×12
DRAPE SURG ORHT 6 SPLT 77X108 (DRAPES) ×6 IMPLANT
DRAPE TOP ARMCOVERS (MISCELLANEOUS) ×4 IMPLANT
DRAPE U-SHAPE 76X120 STRL (DRAPES) ×3 IMPLANT
DRAPE UTILITY XL STRL (DRAPES) ×4 IMPLANT
DRAPE WARM FLUID 44X44 (DRAPES) ×4 IMPLANT
DRSG PAD ABDOMINAL 8X10 ST (GAUZE/BANDAGES/DRESSINGS) ×10 IMPLANT
DRSG TEGADERM 4X4.75 (GAUZE/BANDAGES/DRESSINGS) ×17 IMPLANT
ELECT BLADE 4.0 EZ CLEAN MEGAD (MISCELLANEOUS) ×4
ELECT CAUTERY BLADE 6.4 (BLADE) ×4 IMPLANT
ELECT COATED BLADE 2.86 ST (ELECTRODE) ×4 IMPLANT
ELECT REM PT RETURN 9FT ADLT (ELECTROSURGICAL) ×4
ELECTRODE BLDE 4.0 EZ CLN MEGD (MISCELLANEOUS) ×3 IMPLANT
ELECTRODE REM PT RTRN 9FT ADLT (ELECTROSURGICAL) ×6 IMPLANT
EVACUATOR SILICONE 100CC (DRAIN) ×7 IMPLANT
EXPANDER TISSUE FORTE 300CC (Breast) IMPLANT
GAUZE SPONGE 4X4 12PLY STRL LF (GAUZE/BANDAGES/DRESSINGS) IMPLANT
GLOVE BIO SURGEON STRL SZ 6 (GLOVE) ×12 IMPLANT
GLOVE SURG ENC MOIS LTX SZ7 (GLOVE) ×4 IMPLANT
GLOVE SURG UNDER POLY LF SZ7.5 (GLOVE) ×4 IMPLANT
GOWN STRL REUS W/ TWL LRG LVL3 (GOWN DISPOSABLE) ×15 IMPLANT
GOWN STRL REUS W/TWL LRG LVL3 (GOWN DISPOSABLE) ×20
HEMOSTAT ARISTA ABSORB 3G PWDR (HEMOSTASIS) ×1 IMPLANT
KIT BASIN OR (CUSTOM PROCEDURE TRAY) ×4 IMPLANT
KIT TURNOVER KIT B (KITS) ×4 IMPLANT
MARKER SKIN DUAL TIP RULER LAB (MISCELLANEOUS) ×4 IMPLANT
NDL HYPO 25X1 1.5 SAFETY (NEEDLE) IMPLANT
NDL SAFETY ECLIPSE 18X1.5 (NEEDLE) IMPLANT
NEEDLE HYPO 18GX1.5 SHARP (NEEDLE)
NEEDLE HYPO 25X1 1.5 SAFETY (NEEDLE) IMPLANT
NS IRRIG 1000ML POUR BTL (IV SOLUTION) ×12 IMPLANT
PACK BASIN DAY SURGERY FS (CUSTOM PROCEDURE TRAY) ×4 IMPLANT
PACK GENERAL/GYN (CUSTOM PROCEDURE TRAY) ×4 IMPLANT
PAD ARMBOARD 7.5X6 YLW CONV (MISCELLANEOUS) ×4 IMPLANT
PENCIL SMOKE EVACUATOR (MISCELLANEOUS) ×4 IMPLANT
PIN SAFETY STERILE (MISCELLANEOUS) ×8 IMPLANT
RETRACTOR ONETRAX LX 90X20 (MISCELLANEOUS) ×1 IMPLANT
SET ASEPTIC TRANSFER (MISCELLANEOUS) ×4 IMPLANT
SHEET MEDIUM DRAPE 40X70 STRL (DRAPES) IMPLANT
SLEEVE SCD COMPRESS KNEE MED (STOCKING) ×3 IMPLANT
SOL PREP POV-IOD 4OZ 10% (MISCELLANEOUS) ×4 IMPLANT
SPONGE LAP 18X18 RF (DISPOSABLE) ×6 IMPLANT
SPONGE LAP 4X18 RFD (DISPOSABLE) IMPLANT
STAPLER VISISTAT 35W (STAPLE) ×3 IMPLANT
STRIP CLOSURE SKIN 1/2X4 (GAUZE/BANDAGES/DRESSINGS) IMPLANT
SUT CHROMIC 4 0 PS 2 18 (SUTURE) ×5 IMPLANT
SUT ETHILON 2 0 FS 18 (SUTURE) ×5 IMPLANT
SUT MNCRL AB 4-0 PS2 18 (SUTURE) ×2 IMPLANT
SUT SILK 2 0 SH (SUTURE) ×2 IMPLANT
SUT VIC AB 0 CT2 27 (SUTURE) ×3 IMPLANT
SUT VIC AB 2-0 SH 27 (SUTURE) ×8
SUT VIC AB 2-0 SH 27XBRD (SUTURE) ×6 IMPLANT
SUT VIC AB 3-0 54X BRD REEL (SUTURE) IMPLANT
SUT VIC AB 3-0 BRD 54 (SUTURE)
SUT VIC AB 3-0 SH 27 (SUTURE) ×12
SUT VIC AB 3-0 SH 27X BRD (SUTURE) IMPLANT
SUT VICRYL 3-0 CR8 SH (SUTURE) ×4 IMPLANT
SUT VICRYL 4-0 PS2 18IN ABS (SUTURE) ×2 IMPLANT
SUT VLOC 180 0 24IN GS25 (SUTURE) ×2 IMPLANT
SYR BULB IRRIG 60ML STRL (SYRINGE) ×4 IMPLANT
SYR CONTROL 10ML LL (SYRINGE) IMPLANT
TISSUE EXPANDER FORTE 300CC (Breast) ×8 IMPLANT
TOWEL GREEN STERILE (TOWEL DISPOSABLE) ×4 IMPLANT
TOWEL GREEN STERILE FF (TOWEL DISPOSABLE) ×12 IMPLANT
TRAY FOLEY MTR SLVR 16FR STAT (SET/KITS/TRAYS/PACK) ×1 IMPLANT
TUBE CONNECTING 20X1/4 (TUBING) ×4 IMPLANT
YANKAUER SUCT BULB TIP NO VENT (SUCTIONS) ×3 IMPLANT

## 2020-11-26 NOTE — Anesthesia Procedure Notes (Signed)
Procedure Name: Intubation Date/Time: 11/26/2020 8:43 AM Performed by: Griffin Dakin, CRNA Pre-anesthesia Checklist: Patient identified, Emergency Drugs available, Suction available and Patient being monitored Patient Re-evaluated:Patient Re-evaluated prior to induction Oxygen Delivery Method: Circle system utilized Preoxygenation: Pre-oxygenation with 100% oxygen Induction Type: IV induction Ventilation: Mask ventilation without difficulty and Oral airway inserted - appropriate to patient size Laryngoscope Size: Mac and 3 Grade View: Grade I Tube type: Oral Tube size: 7.0 mm Number of attempts: 1 Airway Equipment and Method: Stylet and Oral airway Placement Confirmation: ETT inserted through vocal cords under direct vision,  positive ETCO2 and breath sounds checked- equal and bilateral Secured at: 21 cm Tube secured with: Tape Dental Injury: Teeth and Oropharynx as per pre-operative assessment

## 2020-11-26 NOTE — Op Note (Addendum)
Preoperative diagnosis: Stage 0 left breast cancer Postoperative diagnosis: saa Procedure: 1. Right risk reducing skin sparing mastectomy 2. Left skin sparing mastectomy 3. Left deep axillary sentinel node biopsy Surgeon: Dr Serita Grammes  Anesthesia: general with pectoral blocks EBL: 50 cc Drains per plastic surgery Complications none Specimens: 1. Right breast tissue marked short superior, long lateral 2. Left breast tissue marked short superior, long lateral 3. Left deep axillary sentinel nodes with highest count 695 Sponge and needle count dispo recovery stable  Indications: 32 yof with c density breasts. there were left breast calcs on the mm. there are loosely grouped calcs on the mm in the uoq. there are also some retroareolar calcs measuring 1.7 cm.  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 on post clip images she has 7.9x4 cm of calcs present. she has an mri that showed some areas on right side that were indeterminate and recommended for biopsy-these did not persist when she returned so no biopsy needed. we discussed bilateral mastectomies per her choice  Procedure:  After informed consent she underwent pectoral blocks and lymphoseek was injected in standard periareolar fashion.  She was given antibiotics and SCDs were in place. She was placed under general anesthesia She was prepped and draped in the standard sterile surgical fashion.  A surgical timeout was performed.   Incision lines were drawn by Dr Iran Planas. I did the right side first. I made an incision to include the nipple and areola. I then created flaps to the clavicle, parasternal region and latissimus.  I removed the breast and the pectoralis fascia.  I then marked as above. I obtained hemostasis. I packed this side and went to the other side  I then used a similar incision on the left side. I created similar flaps and removed the breast from the muscle.  I then marked this as above and passed  off the table.  I then identified the sentinel node in the axilla. I opened the axilla and identified the sentinel node or nodes. I removed these with highest count as above.  There was no background radioactivity.  I obtained hemostasis and closed the axilla with 3-0 vicryl.  I then turned the case over to plastic surgery for reconstruction

## 2020-11-26 NOTE — Interval H&P Note (Signed)
History and Physical Interval Note:  11/26/2020 6:55 AM  Danielle Camacho  has presented today for surgery, with the diagnosis of BREAST CANCER.  The various methods of treatment have been discussed with the patient and family. After consideration of risks, benefits and other options for treatment, the patient has consented to  Procedure(s): LEFT MASTECTOMY WITH LEFT AXILLARY SENTINEL LYMPH NODE BIOPSY (Left) RIGHT RISK REDUCING MASTECTOMY (Right) BILATERAL BREAST RECONSTRUCTION WITH PLACEMENT OF TISSUE EXPANDER AND ALLODERM (Bilateral) as a surgical intervention.  The patient's history has been reviewed, patient examined, no change in status, stable for surgery.  I have reviewed the patient's chart and labs.  Questions were answered to the patient's satisfaction.     Arnoldo Hooker [name]

## 2020-11-26 NOTE — Op Note (Signed)
Operative Note   DATE OF OPERATION: 4.7.22  LOCATION: [facility]  SURGICAL DIVISION: Plastic Surgery  PREOPERATIVE DIAGNOSES:  Left breast DCIS  POSTOPERATIVE DIAGNOSES:  same  PROCEDURE:  1. Bilateral breast reconstruction with tissue expanders 2. Acellular dermis (Alloderm) to bilateral chest 500c m2  SURGEON: Irene Limbo MD MBA  ASSISTANT: Gentry Fitz RNFA  ANESTHESIA:  General.   EBL: 300 ml for entire procedure  COMPLICATIONS: None immediate.   INDICATIONS FOR PROCEDURE:  The patient, Danielle Camacho, is a 55 y.o. female born on [birth year], is here for immediate prepectoral breast reconstruction following bilateral skin sparing mastectomies.    FINDINGS: Natrelle 133S FV-11-T 300 ml tissue expanders placed bilateral. RIGHT initial fill volume 140 ml air SN [id number] LEFT initial fill volume 80 ml air SN [id number]  DESCRIPTION OF PROCEDURE:  The patient was marked to mark sternal notch, chest midline, anterior axillary lines and inframammary folds. Patient was marked for mastectomy with triangular shaped incision with most superior portion nipple areola marked on breast meridian. Vertical limbs marked at lateral border areolae. The patient was taken to the operating room. SCDs were placed and IV antibiotics were given. Foley catheter placed. The patient's operative site was prepped and draped in a sterile fashion. A time out was performed and all information was confirmed to be correct. In supine position, the lateral limbs for resection marked and area over lower pole preserved as inferiorly based dermal pedicle. Skin de epithelialized in this area.Following completion of mastectomies, reconstruction began onrightside.  The cavity was irrigated with solution containingAncef, gentamicin, and Betadine. Hemostasis was ensured. A 19 Fr drain was placed in subcutaneous position laterally anda 15 Fr drain placed along inframammary fold. Eachsecured to skin with 2-0  nylon. The tissue expanderswere prepared on back table prior in insertion. The expander was filled with air.Perforated acellular dermis was draped over anterior surface expander. The ADM was then secured to itself over posterior surface of expanderwith 4-0 chromic. Redundant folds acellular dermis excised so that the ADM layflat without folds over air filled expander.The expander was secured to medial insertion pectoralis with a 0 vicryl.The superior and lateral tabs also secured to pectoralis muscle with 0-vicryl. The ADM was secured to pectoralis muscle and chest wall at inframammary foldwith 0Vlock suture.Laterally the mastectomy flap over posterior axillary line was advanced anteriorly and the subcutaneous tissue and superficial fascia was secured tochest wallwith 0-vicryl. The inferiorly based dermal pedicle was redraped superiorly over expander and acellular dermis and secured to pectoralis with interrupted 0-vicryl. Skin closure completedwith 3-0 vicryl in fascial layer and 4-0 vicryl in dermis. Skin closure completed with 4-0 monocryl subcuticular and tissue adhesive.  I then directed my attention toleftchest where similar irrigation and drain placement completed. The prepared expander with ADM secured over anterior surface was placed inleft chest and tabs secured to chest wall and pectoralis muscle with 0- vicryl suture. The acellular dermis at inframammary fold was secured to chest wall with 0 V-lock suture.Laterally the mastectomy flap over posterior axillary line was advanced anteriorly and the subcutaneous tissue and superficial fascia was secured tochest wallwith 0-vicryl. The inferiorly based dermal pedicle was redraped superiorly over expander and acellular dermis and secured to pectoralis with interrupted 0-vicryl. Skin closure completedwith 3-0 vicryl in fascial layer and 4-0 vicryl in dermis. Skin closure completed with 4-0 monocryl subcuticular and tissue  adhesive.Tegaderms applied bilateral, followed by dry dressing and breast binder.  The patient was allowed to wake from anesthesia, extubated and taken to the recovery  room in satisfactory condition.   SPECIMENS: none  DRAINS: 15 and 19 Fr drain in right and left breast reconstruction

## 2020-11-26 NOTE — H&P (Signed)
55 yof otherwise healthy, no family history of cancer presents after screening mm. she has prior left breast cyst aspirations and a negative stereo biopsy but nothing else. she has c density breasts. there were left breast calcs on the mm. there are loosely grouped calcs on the mm in the uoq. there are also some retroareolar calcs measuring 1.7 cm. right breast negative. 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 on post clip images she has 7.9x4 cm of calcs present. she has an mri that showed some areas on right side that were indeterminate and recommended for biopsy-these did not persist when she returned so no biopsy needed. lthe left side shows some skin thickening, no nodes abnormal. 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 she is here with her husband to discuss options   Past Surgical History  Breast Biopsy  Left. Gallbladder Surgery - Laparoscopic  Hysterectomy (not due to cancer) - Partial  Oral Surgery  Tonsillectomy   Diagnostic Studies History  Colonoscopy  1-5 years ago Mammogram  within last year Pap Smear  1-5 years ago  Allergies  No Known Drug Allergies   Medication History  valACYclovir HCl (1GM Tablet, Oral) Active. Anusol-HC (25MG  Suppository, Rectal) Active. Medications Reconciled  Social History  Alcohol use  Occasional alcohol use. Caffeine use  Coffee. No drug use  Tobacco use  Former smoker.  Family History  Alcohol Abuse  Father. Diabetes Mellitus  Father. Hypertension  Father, Mother, Sister. Thyroid problems  Mother.  Pregnancy / Birth History  Age at menarche  30 years. Age of menopause  9-50 Gravida  2 Length (months) of breastfeeding  3-6 Maternal age  60-25 Para  2 Regular periods    Review of Systems  General Not Present- Appetite Loss, Chills, Fatigue, Fever, Night Sweats, Weight Gain and Weight Loss. Skin Not Present- Change in  Wart/Mole, Dryness, Hives, Jaundice, New Lesions, Non-Healing Wounds, Rash and Ulcer. HEENT Present- Wears glasses/contact lenses. Not Present- Earache, Hearing Loss, Hoarseness, Nose Bleed, Oral Ulcers, Ringing in the Ears, Seasonal Allergies, Sinus Pain, Sore Throat, Visual Disturbances and Yellow Eyes. Respiratory Not Present- Bloody sputum, Chronic Cough, Difficulty Breathing, Snoring and Wheezing. Cardiovascular Not Present- Chest Pain, Difficulty Breathing Lying Down, Leg Cramps, Palpitations, Rapid Heart Rate, Shortness of Breath and Swelling of Extremities. Gastrointestinal Not Present- Abdominal Pain, Bloating, Bloody Stool, Change in Bowel Habits, Chronic diarrhea, Constipation, Difficulty Swallowing, Excessive gas, Gets full quickly at meals, Hemorrhoids, Indigestion, Nausea, Rectal Pain and Vomiting. Female Genitourinary Not Present- Frequency, Nocturia, Painful Urination, Pelvic Pain and Urgency. Musculoskeletal Not Present- Back Pain, Joint Pain, Joint Stiffness, Muscle Pain, Muscle Weakness and Swelling of Extremities. Neurological Not Present- Decreased Memory, Fainting, Headaches, Numbness, Seizures, Tingling, Tremor, Trouble walking and Weakness. Psychiatric Not Present- Anxiety, Bipolar, Change in Sleep Pattern, Depression, Fearful and Frequent crying. Endocrine Present- Hot flashes. Not Present- Cold Intolerance, Excessive Hunger, Hair Changes, Heat Intolerance and New Diabetes. Hematology Not Present- Blood Thinners, Easy Bruising, Excessive bleeding, Gland problems, HIV and Persistent Infections.   Physical Exam  General Mental Status-Alert. Orientation-Oriented X3. Breast Nipples-No Discharge. Breast Lump-No Palpable Breast Mass. Lymphatic Head & Neck General Head & Neck Lymphatics: Bilateral - Description - Normal. Axillary General Axillary Region: Bilateral - Description - Normal. 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 We discussed all of the different options for treatment for breast cancer including surgery, chemotherapy, radiation therapy, Herceptin, and antiestrogen therapy. she desired genetic testing today. we discussed insurance issues. i discussed pos, negative and VUS results. will send salivary testing today I think she needs ssm on the left side. I dont think nsm possible. I do not think she has anything concerning in her skin or certainly IBC as mentioned in MRI report. she is candidate for immediate recon and I will have her see plastic surgery. she desires risk reducing mastectomy on right. we discussed this has no benefit on survival and is not 100% preventive. it certainly would be more symmetric. discussed could take antiestrogens instead. she could have nsm or ssm on the right side and will decide after seeing plastic surgery. We discussed a sentinel lymph node biopsy as she does not appear to having lymph node involvement right now. We discussed the performance of that with injection of radioactive tracer. We discussed that there is a chance of having a positive node with a sentinel lymph node biopsy and we will await the permanent pathology to make any other first further decisions in terms of her treatment. We discussed up to a 5% risk lifetime of chronic shoulder pain as well as lymphedema associated with a sentinel lymph node biopsy. we discussed risk of invasive disease at excision as reason for sn biopsy and that any further treatment would be based on final pathology

## 2020-11-26 NOTE — Anesthesia Preprocedure Evaluation (Addendum)
Anesthesia Evaluation  Patient identified by MRN, date of birth, ID band Patient awake    Reviewed: Allergy & Precautions, NPO status , Patient's Chart, lab work & pertinent test results  Airway Mallampati: II  TM Distance: >3 FB Neck ROM: Full    Dental  (+) Teeth Intact, Dental Advisory Given   Pulmonary    breath sounds clear to auscultation       Cardiovascular  Rhythm:Regular Rate:Normal     Neuro/Psych    GI/Hepatic   Endo/Other    Renal/GU      Musculoskeletal   Abdominal   Peds  Hematology   Anesthesia Other Findings   Reproductive/Obstetrics                             Anesthesia Physical Anesthesia Plan  ASA: II  Anesthesia Plan: General   Post-op Pain Management:  Regional for Post-op pain   Induction: Intravenous  PONV Risk Score and Plan: Ondansetron and Dexamethasone  Airway Management Planned: Oral ETT  Additional Equipment:   Intra-op Plan:   Post-operative Plan: Extubation in OR  Informed Consent: I have reviewed the patients History and Physical, chart, labs and discussed the procedure including the risks, benefits and alternatives for the proposed anesthesia with the patient or authorized representative who has indicated his/her understanding and acceptance.     Dental advisory given  Plan Discussed with: CRNA and Anesthesiologist  Anesthesia Plan Comments: (Plan GA with oral ETT and bilateral pectoralis blocks.)       Anesthesia Quick Evaluation

## 2020-11-26 NOTE — Interval H&P Note (Signed)
History and Physical Interval Note:  11/26/2020 7:09 AM  Danielle Camacho  has presented today for surgery, with the diagnosis of BREAST CANCER.  The various methods of treatment have been discussed with the patient and family. After consideration of risks, benefits and other options for treatment, the patient has consented to  Procedure(s): LEFT MASTECTOMY WITH LEFT AXILLARY SENTINEL LYMPH NODE BIOPSY (Left) RIGHT RISK REDUCING MASTECTOMY (Right) BILATERAL BREAST RECONSTRUCTION WITH PLACEMENT OF TISSUE EXPANDER AND ALLODERM (Bilateral) as a surgical intervention.  The patient's history has been reviewed, patient examined, no change in status, stable for surgery.  I have reviewed the patient's chart and labs.  Questions were answered to the patient's satisfaction.     Rolm Bookbinder

## 2020-11-26 NOTE — Transfer of Care (Signed)
Immediate Anesthesia Transfer of Care Note  Patient: Danielle Camacho  Procedure(s) Performed: LEFT MASTECTOMY WITH LEFT AXILLARY SENTINEL LYMPH NODE BIOPSY (Left Breast) RIGHT RISK REDUCING MASTECTOMY (Right Breast) BILATERAL BREAST RECONSTRUCTION WITH PLACEMENT OF TISSUE EXPANDER AND ALLODERM (Bilateral Breast)  Patient Location: PACU  Anesthesia Type:General  Level of Consciousness: drowsy  Airway & Oxygen Therapy: Patient Spontanous Breathing and Patient connected to face mask oxygen  Post-op Assessment: Report given to RN and Post -op Vital signs reviewed and stable  Post vital signs: Reviewed and stable  Last Vitals:  Vitals Value Taken Time  BP 125/65 11/26/20 1111  Temp    Pulse 59 11/26/20 1115  Resp 15 11/26/20 1115  SpO2 100 % 11/26/20 1115  Vitals shown include unvalidated device data.  Last Pain:  Vitals:   11/26/20 0602  TempSrc:   PainSc: 0-No pain         Complications: No complications documented.

## 2020-11-26 NOTE — Progress Notes (Signed)
Danielle Camacho is a 55 y.o. female patient admitted. Awake, alert - oriented  X 4 - no acute distress noted.  VSS - Blood pressure 118/74, pulse 60, temperature 99.2 F (37.3 C), temperature source Oral, resp. rate 18, height 5\' 2"  (1.575 m), weight 51.4 kg, SpO2 99 %.    IV in place, occlusive dsg intact without redness.  Orientation to room, and floor completed.  Admission INP armband ID verified with patient/family, and in place.   SR up x 2, fall assessment complete, with patient and family able to verbalize understanding of risk associated with falls, and verbalized understanding to call nsg before up out of bed.  Call light within reach, patient able to voice, and demonstrate understanding. No evidence of skin break down noted on exam.  Admission nurse notified of admission.     Will cont to eval and treat per MD orders.  Cyndi Bender, RN 11/26/2020 12:49 PM

## 2020-11-26 NOTE — Anesthesia Postprocedure Evaluation (Signed)
Anesthesia Post Note  Patient: Danielle Camacho  Procedure(s) Performed: LEFT MASTECTOMY WITH LEFT AXILLARY SENTINEL LYMPH NODE BIOPSY (Left Breast) RIGHT RISK REDUCING MASTECTOMY (Right Breast) BILATERAL BREAST RECONSTRUCTION WITH PLACEMENT OF TISSUE EXPANDER AND ALLODERM (Bilateral Breast)     Patient location during evaluation: PACU Anesthesia Type: General and Regional Level of consciousness: awake Pain management: pain level controlled Vital Signs Assessment: post-procedure vital signs reviewed and stable Respiratory status: spontaneous breathing, nonlabored ventilation, respiratory function stable and patient connected to nasal cannula oxygen Cardiovascular status: blood pressure returned to baseline and stable Postop Assessment: no apparent nausea or vomiting Anesthetic complications: no   No complications documented.  Last Vitals:  Vitals:   11/26/20 1156 11/26/20 1232  BP: 127/76 118/74  Pulse: 62 60  Resp: 17 18  Temp: 36.4 C 37.3 C  SpO2: 100% 99%    Last Pain:  Vitals:   11/26/20 1245  TempSrc:   PainSc: 6                  Deniro Laymon P Malachai Schalk

## 2020-11-26 NOTE — Anesthesia Procedure Notes (Signed)
Anesthesia Regional Block: Pectoralis block   Pre-Anesthetic Checklist: ,, timeout performed, Correct Patient, Correct Site, Correct Laterality, Correct Procedure, Correct Position, site marked, Risks and benefits discussed,  Surgical consent,  Pre-op evaluation,  At surgeon's request and post-op pain management  Laterality: Left  Prep: chloraprep       Needles:  Injection technique: Single-shot  Needle Type: Echogenic Needle     Needle Length: 9cm  Needle Gauge: 21     Additional Needles:   Narrative:  Start time: 11/26/2020 7:05 AM End time: 11/26/2020 7:10 AM Injection made incrementally with aspirations every 5 mL.  Performed by: Personally  Anesthesiologist: Roberts Gaudy, MD  Additional Notes: 30 cc 0.25% Bupivacaine with 1:200 Epi 10 cc 1.3% Exparel

## 2020-11-26 NOTE — Discharge Instructions (Signed)
[facility] °[phone] ° °MASTECTOMY: POST OP INSTRUCTIONS °Take 400 mg of ibuprofen every 8 hours or 650 mg tylenol every 6 hours for next 72 hours then as needed. Use ice several times daily also. °Always review your discharge instruction sheet given to you by the facility where your surgery was performed. ° °IF YOU HAVE DISABILITY OR FAMILY LEAVE FORMS, YOU MUST BRING THEM TO THE OFFICE FOR PROCESSING.   °DO NOT GIVE THEM TO YOUR DOCTOR. °A prescription for pain medication may be given to you upon discharge.  Take your pain medication as prescribed, if needed.  If narcotic pain medicine is not needed, then you may take acetaminophen (Tylenol), naprosyn (Alleve) or ibuprofen (Advil) as needed. °1. Take your usually prescribed medications unless otherwise directed. °2. If you need a refill on your pain medication, please contact your pharmacy.  They will contact our office to request authorization.  Prescriptions will not be filled after 5pm or on week-ends. °3. You should follow a light diet the first few days after arrival home, such as soup and crackers, etc.  Resume your normal diet the day after surgery. °4. Most patients will experience some swelling and bruising on the chest and underarm.  Ice packs will help.  Swelling and bruising can take several days to resolve. Wear the binder day and night until you return to the office.  °5. It is common to experience some constipation if taking pain medication after surgery.  Increasing fluid intake and taking a stool softener (such as Colace) will usually help or prevent this problem from occurring.  A mild laxative (Milk of Magnesia or Miralax) should be taken according to package instructions if there are no bowel movements after 48 hours. °6. Unless discharge instructions indicate otherwise, leave your bandage dry and in place until your next appointment in 3-5 days.  You may take a limited sponge bath.  No tube baths or showers until the  drains are removed.  You may have steri-strips (small skin tapes) in place directly over the incision.  These strips should be left on the skin for 7-10 days. If you have glue it will come off in next couple week.  Any sutures will be removed at an office visit °7. DRAINS:  If you have drains in place, it is important to keep a list of the amount of drainage produced each day in your drains.  Before leaving the hospital, you should be instructed on drain care.  Call our office if you have any questions about your drains. I will remove your drains when they put out less than 30 cc or ml for 2 consecutive days. °8. ACTIVITIES:  You may resume regular (light) daily activities beginning the next day--such as daily self-care, walking, climbing stairs--gradually increasing activities as tolerated.  You may have sexual intercourse when it is comfortable.  Refrain from any heavy lifting or straining until approved by your doctor. °a. You may drive when you are no longer taking prescription pain medication, you can comfortably wear a seatbelt, and you can safely maneuver your car and apply brakes. °b. RETURN TO WORK:  __________________________________________________________ °9. You should see your doctor in the office for a follow-up appointment approximately 3-5 days after your surgery.  Your doctor’s nurse will typically make your follow-up appointment when she calls you with your pathology report.  Expect your pathology report 3-4business days after surgery. °10. OTHER INSTRUCTIONS: ______________________________________________________________________________________________ ____________________________________________________________________________________________ °WHEN TO CALL YOUR DR Midori Dado: °1. Fever over 101.0 °  2. Nausea and/or vomiting °3. Extreme swelling or bruising °4. Continued bleeding from incision. °5. Increased pain, redness, or drainage from the incision. °The clinic staff is available to answer your  questions during regular business hours.  Please don’t hesitate to call and ask to speak to one of the nurses for clinical concerns.  If you have a medical emergency, go to the nearest emergency room or call 911.  A surgeon from [facility] is always on call at the hospital. °[address], [address], [place], [place]  [address] ? [address] [address], [place], [place]   [address] °[phone] ? [phone] ? FAX [phone] °Web site: [email or web address] ° °

## 2020-11-26 NOTE — Anesthesia Procedure Notes (Signed)
Anesthesia Regional Block: Pectoralis block   Pre-Anesthetic Checklist: ,, timeout performed, Correct Patient, Correct Site, Correct Laterality, Correct Procedure, Correct Position, site marked, Risks and benefits discussed,  Surgical consent,  Pre-op evaluation,  At surgeon's request and post-op pain management  Laterality: Right  Prep: chloraprep       Needles:  Injection technique: Single-shot  Needle Type: Echogenic Needle     Needle Length: 9cm  Needle Gauge: 21     Additional Needles:   Procedures:,,,, ultrasound used (permanent image in chart),,,,  Narrative:  Start time: 11/26/2020 7:10 AM End time: 11/26/2020 7:15 AM Injection made incrementally with aspirations every 5 mL.  Performed by: Personally  Anesthesiologist: Roberts Gaudy, MD  Additional Notes: 30 cc 0.25% Bupivacaine with 1:200 Epi 10 cc 1.3% Exparel injected easily

## 2020-11-27 ENCOUNTER — Encounter (HOSPITAL_COMMUNITY): Payer: Self-pay | Admitting: General Surgery

## 2020-11-27 DIAGNOSIS — D0512 Intraductal carcinoma in situ of left breast: Secondary | ICD-10-CM

## 2020-11-27 NOTE — Plan of Care (Signed)
  Problem: Education: Goal: Knowledge of General Education information will improve Description: Including pain rating scale, medication(s)/side effects and non-pharmacologic comfort measures Outcome: Completed/Met   Problem: Health Behavior/Discharge Planning: Goal: Ability to manage health-related needs will improve Outcome: Completed/Met   Problem: Clinical Measurements: Goal: Ability to maintain clinical measurements within normal limits will improve Outcome: Completed/Met Goal: Will remain free from infection Outcome: Completed/Met Goal: Diagnostic test results will improve Outcome: Completed/Met Goal: Respiratory complications will improve Outcome: Completed/Met Goal: Cardiovascular complication will be avoided Outcome: Completed/Met   Problem: Nutrition: Goal: Adequate nutrition will be maintained Outcome: Completed/Met   Problem: Coping: Goal: Level of anxiety will decrease Outcome: Completed/Met   Problem: Elimination: Goal: Will not experience complications related to bowel motility Outcome: Completed/Met Goal: Will not experience complications related to urinary retention Outcome: Completed/Met   Problem: Pain Managment: Goal: General experience of comfort will improve Outcome: Completed/Met   Problem: Safety: Goal: Ability to remain free from injury will improve Outcome: Completed/Met   Problem: Skin Integrity: Goal: Risk for impaired skin integrity will decrease Outcome: Completed/Met   Problem: Education: Goal: Knowledge of disease or condition will improve Outcome: Completed/Met   Problem: Activity: Goal: Ability to maintain or regain function will improve Outcome: Completed/Met   Problem: Clinical Measurements: Goal: Postoperative complications will be avoided or minimized Outcome: Completed/Met   Problem: Self-Concept: Goal: Ability to verbalize positive feelings about self will improve Outcome: Completed/Met   Problem: Pain  Management: Goal: Expressions of feelings of enhanced comfort will increase Outcome: Completed/Met   Problem: Skin Integrity: Goal: Demonstration of wound healing without infection will improve Outcome: Completed/Met

## 2020-11-27 NOTE — Discharge Summary (Signed)
Physician Discharge Summary  Patient ID: Nancylee Gaines MRN: [id number] DOB/AGE: 12-31-1965 55 y.o.  Admit date: 11/26/2020 Discharge date: 11/27/2020  Admission Diagnoses: Left breast DCIS  Discharge Diagnoses:  Left breast DCIS  Discharged Condition: stable  Hospital Course: Post operatively patient did well with pain controlled tolerating diet and ambulatory without assist. Instructed on drain care and bathing.  Treatments: surgery: bilateral mastectomies left sentinel node bilateral breast reconstruction with tissue expanders acellular dermis 4.7.22  Discharge Exam: Blood pressure (!) 102/53, pulse 60, temperature 98 F (36.7 C), temperature source Oral, resp. rate 17, height 5\' 2"  (1.575 m), weight 51.4 kg, SpO2 100 %. Incision/Wound: chest soft bilateral developing ecchymoses without hematoma incisions dry intact drains serosanguinous  Disposition: Discharge disposition: 01-Home or Self Care       Discharge Instructions    Call MD for:  redness, tenderness, or signs of infection (pain, swelling, bleeding, redness, odor or green/yellow discharge around incision site)   Complete by: As directed    Call MD for:  temperature >100.5   Complete by: As directed    Discharge instructions   Complete by: As directed    Ok to remove dressings and shower am 4.9.22. Soap and water ok, pat Tegaderms dry. Do not remove Tegaderms. No creams or ointments over incisions. Do not let drains dangle in shower, attach to lanyard or similar.Strip and record drains twice daily and bring log to clinic visit.  Breast binder or soft compression bra all other times.  Ok to raise arms above shoulders for bathing and dressing.  No house yard work or exercise until cleared by MD.   Patient received all Rx preop. Recommend ibuprofen with meals to aid with pain control. Recommend Miralax or Dulcolax as needed for constipation. Also ok to use Tylenol for pain.   Driving Restrictions   Complete by: As  directed    No driving for 2 weeks then no driving if taking narcotics   Lifting restrictions   Complete by: As directed    No lifting > 5 lbs until cleared by MD   Resume previous diet   Complete by: As directed      Allergies as of 11/27/2020   No Known Allergies     Medication List    TAKE these medications   escitalopram 5 MG tablet Commonly known as: LEXAPRO Take 5 mg by mouth at bedtime.   LORazepam 0.5 MG tablet Commonly known as: ATIVAN Take 0.5 mg by mouth every 8 (eight) hours as needed for anxiety.   melatonin 5 MG Tabs Take 10 mg by mouth at bedtime as needed.   valACYclovir 500 MG tablet Commonly known as: VALTREX Take 500 mg by mouth 2 (two) times daily as needed (fever blisters).       Follow-up Information    Rolm Bookbinder, MD In 2 weeks.   Specialty: General Surgery Contact information: [address] [place] [address] [phone]        Irene Limbo, MD In 1 week.   Specialty: Plastic Surgery Why: as scheduled Contact information: [place] [address] [place] [address] [phone]               Signed: Irene Limbo 11/27/2020, 7:18 AM

## 2020-11-30 LAB — SURGICAL PATHOLOGY

## 2020-12-23 ENCOUNTER — Ambulatory Visit: Payer: Commercial Managed Care - PPO | Attending: General Surgery | Admitting: Physical Therapy

## 2020-12-23 ENCOUNTER — Other Ambulatory Visit: Payer: Self-pay

## 2020-12-23 DIAGNOSIS — Z483 Aftercare following surgery for neoplasm: Secondary | ICD-10-CM | POA: Diagnosis present

## 2020-12-23 DIAGNOSIS — M25611 Stiffness of right shoulder, not elsewhere classified: Secondary | ICD-10-CM | POA: Insufficient documentation

## 2020-12-23 DIAGNOSIS — D0512 Intraductal carcinoma in situ of left breast: Secondary | ICD-10-CM | POA: Diagnosis present

## 2020-12-23 DIAGNOSIS — R293 Abnormal posture: Secondary | ICD-10-CM | POA: Insufficient documentation

## 2020-12-23 DIAGNOSIS — M25612 Stiffness of left shoulder, not elsewhere classified: Secondary | ICD-10-CM | POA: Diagnosis present

## 2020-12-23 NOTE — Therapy (Signed)
Danielle Camacho, [place], [address] Phone: [phone]   Fax:  [phone]  Physical Therapy Evaluation  Patient Details  Name: Danielle Camacho MRN: [id number] Date of Birth: 1966-06-23 Referring Provider (PT): Thimmappa   Encounter Date: 12/23/2020   PT End of Session - 12/23/20 1450    Number of Visits 4    Date for PT Re-Evaluation 03/19/21           Past Medical History:  Diagnosis Date  . Cancer [facility])    DCIS-left side Breast Cancer  . Depression     Past Surgical History:  Procedure Laterality Date  . ABDOMINAL HYSTERECTOMY    . BREAST RECONSTRUCTION WITH PLACEMENT OF TISSUE EXPANDER AND ALLODERM Bilateral 11/26/2020   Procedure: BILATERAL BREAST RECONSTRUCTION WITH PLACEMENT OF TISSUE EXPANDER AND ALLODERM;  Surgeon: Irene Limbo, MD;  Location: [facility];  Service: Plastics;  Laterality: Bilateral;  . CHOLECYSTECTOMY    . MASTECTOMY W/ SENTINEL NODE BIOPSY Left 11/26/2020   Procedure: LEFT MASTECTOMY WITH LEFT AXILLARY SENTINEL LYMPH NODE BIOPSY;  Surgeon: Rolm Bookbinder, MD;  Location: [facility];  Service: General;  Laterality: Left;  . TONSILLECTOMY    . TOTAL MASTECTOMY Right 11/26/2020   Procedure: RIGHT RISK REDUCING MASTECTOMY;  Surgeon: Rolm Bookbinder, MD;  Location: [facility];  Service: General;  Laterality: Right;    There were no vitals filed for this visit.    Subjective Assessment - 12/23/20 1257    Subjective Pt says she is doing well after surgery.  She has started back to walking she has been doing exercises for her shoulders on her own. She plans to talk to Dr. Iran Planas tommorrow about implant surgery.  She says that she is abl to lay flat but it takes a while for her to relax She says she starts back to work on Monday light duty    Pertinent History No pertinent information in chart yet.  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 No/denies              [facility] PT Assessment - 12/23/20 0001      Assessment   Medical Diagnosis Intraductal Carcinoma in situ left breast    Referring Provider (PT) Thimmappa    Onset Date/Surgical Date 11/26/20      Prior Function   Level of Independence Independent      Observation/Other Assessments   Observations Pt is healing from bilateal mastectomies with expanders in place She appears well.  She says she has to move slowly but is able to lie flat on her back after a few minutes She is wearing a bra that fits well and reports her incisions are healing well. She has one guitar string cord in left axilla that she says has been improving.      ROM / Strength   AROM / PROM / Strength AROM      AROM   Right Shoulder Flexion 152 Degrees    Right Shoulder ABduction 180 Degrees    Left Shoulder Flexion 130 Degrees    Left Shoulder ABduction 148 Degrees  Objective measurements completed on examination: See above findings.       [facility] PT Treatment/Exercise - 12/23/20 0001      Self-Care   Self-Care Other Self-Care Comments    Other Self-Care Comments  issued Tg soft to see if it would help with cording, explained to pt to discard it at any sign of discomfort. Talked to patient about getting a compression sleeve if needed for heavy arm work or lifting at work, but she likely will wait until SOZO measurement in July to see if it is needed encouraged pt to sign up for ABC class and gave link for [email or web address] education and reassured her that her risk for lymphedema is low.      Exercises   Exercises Shoulder;Other Exercises    Other Exercises  pt is an experienced exerciser and is anxious to get back to  exercise.  issued Strength after Breast Cancer program and explained to start our very low weight  using no weight to do the motions, or use a water bottle and progress slowly from there. Cautioned against doing planks or any body weight exercises for now. She was encouraged to keep stretching      Shoulder Exercises: Supine   Protraction AROM;Both;5 reps   with dowel to limits   Flexion AAROM;Both;5 reps   with dowel   Flexion Limitations deep breath at the top, encouraged to take it to her limits but not to push too hard    ABduction AAROM;Both;5 reps   with dowel                 PT Education - 12/23/20 1438    Education Details how to progress with Strength After Breast Cancer exercise program ( pt very familiar with exercising) to start low and progress slowly paying attention to her body's response to exericse.    Person(s) Educated Patient    Methods Explanation;Handout    Comprehension Verbalized understanding               PT Long Term Goals - 12/23/20 1447      PT LONG TERM GOAL #1   Title Pts post surgical shoulder ROM will return to pre-surgical ROM and function    Time 8    Period Weeks    Status On-going                  Plan - 12/23/20 1440    Clinical Impression Statement Pt comes back to PT after bilateral mastectomy. 3 sentinel nodes on left, and immediate expanders. She is doing well, but still as limitation in endranges of ROM of both shoulders. Upgraded to dowel stretching and instructed in strength program to start very low and progress slowly Pt is experienced in exercise and is anxious to return, but she was cautioned not to return aggressively due to risk of seroma. She was encouraged to continue gentle stretching. She feels that she can continue to exercise on her own at home. She will come back for SOZO screen in July to monitor lymphedema and need for compression sleeve. She will call if she has questions with exericse and wants to come for  PT treatement.    Personal Factors and Comorbidities Comorbidity 2    Comorbidities mastectomy, sentinel node removal on left    Examination-Activity Limitations Reach Overhead    Examination-Participation Restrictions Occupation    Stability/Clinical Decision Making Stable/Uncomplicated    Clinical Decision Making Low    Rehab Potential Excellent  PT Next Visit Plan continue monitor for SOZO,schedule  more PT if pt reprots limited shoulder ROM at that time    Consulted and Agree with Plan of Care Patient           Patient will benefit from skilled therapeutic intervention in order to improve the following deficits and impairments:  Decreased range of motion,Impaired UE functional use,Impaired flexibility,Decreased strength  Visit Diagnosis: Abnormal posture - Plan: PT plan of care cert/re-cert  Intraductal carcinoma in situ of left breast - Plan: PT plan of care cert/re-cert  Stiffness of right shoulder, not elsewhere classified - Plan: PT plan of care cert/re-cert  Stiffness of left shoulder, not elsewhere classified - Plan: PT plan of care cert/re-cert  Aftercare following surgery for neoplasm - Plan: PT plan of care cert/re-cert     Problem List Patient Active Problem List   Diagnosis Date Noted  . Breast neoplasm, Tis (DCIS), left 11/27/2020   Donato Heinz. Owens Shark PT  Norwood Levo 12/23/2020, 2:52 PM  [place], [place], [address] Phone: [phone]   Fax:  [phone]  Name: Danielle Camacho MRN: [id number] Date of Birth: Jan 22, 1966

## 2020-12-23 NOTE — Patient Instructions (Addendum)
Www.[email or web address].com Courses Online Strength After Breast Cancer Look at the right of the page for Lymphedema Education Session   First of all, check with your insurance company to see if provider is in [place] (for wigs and compression sleeves / gloves/gauntlets )  [place], [place] [address] [phone]  Will file some insurances --- call for appointment   Second to [facility] (for mastectomy prosthetics and garments) [place], [place] [address] [phone] Will file some insurances --- call for appointment  [facility]  [address]  [place], [place] [address] [phone] Lower extremity garments  [facility] [address] [place], [place]  [address] [place] and Prosthetics (for compression garments, especilly for lower extremities) [address] [place], [place]  [address] [phone] Call for appointment    Jer[facility] [employer] ,certified fitter [place]  [phone]  [employer] (for mastectomy supplies and garments) [place] [place], [place] [address] [phone]  Other Resources: National Lymphedema Network:  www.lymphnet.org www.Klosetraining.com for patient articles and self manual lymph drainage information www.lymphedemablog.com has informative articles.  DishTag.[email or web address].com www.lymphedemaproducts.com Www.brightlifedirect.com  SHOULDER: Flexion - Supine (Cane)        Cancer Rehab [phone]    Hold cane in both hands. Raise arms up overhead. Do not allow back to arch. Hold _5__ seconds. Do __5-10__ times; __1-2__ times a day.   SELF ASSISTED WITH OBJECT: Shoulder Abduction / Adduction - Supine    Hold cane with both hands. Move both arms from side to side, keep elbows straight.  Hold when stretch felt for __5__ seconds. Repeat __5-10__ times; __1-2__ times a day. Once this  becomes easier progress to third picture bringing affected arm towards ear by staying out to side. Same hold for _5_seconds. Repeat  _5-10_ times, _1-2_ times/day.  Shoulder Blade Stretch    Clasp fingers behind head with elbows touching in front of face. Pull elbows back while pressing shoulder blades together. Relax and hold as tolerated, can place pillow under elbow here for comfort as needed and to allow for prolonged stretch.  Repeat __5__ times. Do __1-2__ sessions per day.

## 2020-12-25 ENCOUNTER — Other Ambulatory Visit (HOSPITAL_COMMUNITY): Payer: Commercial Managed Care - PPO

## 2020-12-29 ENCOUNTER — Ambulatory Visit (HOSPITAL_COMMUNITY): Payer: Commercial Managed Care - PPO

## 2021-02-02 ENCOUNTER — Other Ambulatory Visit: Payer: Self-pay

## 2021-02-02 ENCOUNTER — Encounter (HOSPITAL_BASED_OUTPATIENT_CLINIC_OR_DEPARTMENT_OTHER): Payer: Self-pay | Admitting: Plastic Surgery

## 2021-02-03 NOTE — H&P (Signed)
Subjective:     Patient ID: Danielle Camacho is a 55 y.o. female.   HPI   10 weeks post op. Scheduled for implant exchange next month.   Presented following screening MMG with left breast calcifications. Biopsy demonstrated high grade DCIS ER+/PR-. Calcifications span 7.9 cm. Axillary [place] negaitve   MRI RIGHT breast showed no abnormal LN, three areas of circumferential ring like enhancement within the RIGHT breast compatible with benign cystic changes.    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 Margins clear. 0/3 SLN   Genetics - no testing to date.   Prior 34B.  Right mastectomy 292 g Left mastectomy 243 g   Works as Therapist, occupational in [place]. 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 Has two daughters.   Review of Systems      Objective:   Physical Exam Cardiovascular:     Rate and Rhythm: Normal rate and regular rhythm.     Heart sounds: Normal heart sounds.  Pulmonary:     Effort: Pulmonary effort is normal.     Breath sounds: Normal breath sounds.       Chest: Soft bilateral, bilateral chest expanded Right chest rippling noted and superior expander tab prominent/visible    Assessment:     DCIS left breast S/p bilateral SSM, left SLN, bilateral prepectoral TE/ADM (Alloderm) reconstruction    Plan:    Plan removal bilateral chest tissue expanders and placement silicone implants.   Reviewed saline vs silicone, shaped v round. As in prepectoral position I recommend HCG or capacity filled silicone implants to reduce risk visible rippling. Reviewed MRI or [place] surveillance for rupture with silicone implants. Reviewed examples for 4th generation, capacity filled 4th generation, and HCG implants vs saline implants. reviewed shared risks rupture capsular contracture infection requiring removal devices. Reviewed implants are not permanent devices and may require additional surgery. Reviewed size in part guided by CW. Patient has elected for silicone.  Plan smooth round.   Discussed purpose fat grafting to thicken flaps, reduce visible rippling. Reviewed variable take graft, may need to repeat, fat necrosis that presents as masses, abdominal incisions, pain need for compression. Patient has minimal donor sites for this and will defer.   Additional risks including but not limited to bleeding hematoma seroma asymmetry damage to adjacent structures unacceptable cosmetic result blood clots in legs or lungs reviewed.   Reviewed OP surgery and post op limitations. No drains anticipated.   Completed [facility] physician patient checklist   Rx for Bactrim given. Has oxycodone and Robaxin at home- will let me know if additional Rx needed on day of surgery.    Natrelle 133S FV-11-T 300 ml tissue expanders placed bilateral.  RIGHT fill volume 275 ml saline LEFT fill volume 275 ml saline

## 2021-02-03 NOTE — Progress Notes (Signed)

## 2021-02-18 NOTE — Anesthesia Preprocedure Evaluation (Addendum)
Anesthesia Evaluation  Patient identified by MRN, date of birth, ID band Patient awake    Reviewed: Allergy & Precautions, NPO status , Patient's Chart, lab work & pertinent test results  Airway Mallampati: II  TM Distance: >3 FB     Dental   Pulmonary    breath sounds clear to auscultation       Cardiovascular negative cardio ROS   Rhythm:Regular Rate:Normal     Neuro/Psych    GI/Hepatic Neg liver ROS,   Endo/Other  negative endocrine ROS  Renal/GU negative Renal ROS     Musculoskeletal   Abdominal   Peds  Hematology   Anesthesia Other Findings   Reproductive/Obstetrics                             Anesthesia Physical Anesthesia Plan  ASA: 3  Anesthesia Plan: General   Post-op Pain Management:    Induction: Intravenous  PONV Risk Score and Plan: 3 and Ondansetron, Dexamethasone and Midazolam  Airway Management Planned: Oral ETT  Additional Equipment:   Intra-op Plan:   Post-operative Plan: Extubation in OR  Informed Consent: I have reviewed the patients History and Physical, chart, labs and discussed the procedure including the risks, benefits and alternatives for the proposed anesthesia with the patient or authorized representative who has indicated his/her understanding and acceptance.     Dental advisory given  Plan Discussed with: Anesthesiologist and CRNA  Anesthesia Plan Comments:        Anesthesia Quick Evaluation

## 2021-02-19 ENCOUNTER — Other Ambulatory Visit: Payer: Self-pay

## 2021-02-19 ENCOUNTER — Encounter (HOSPITAL_BASED_OUTPATIENT_CLINIC_OR_DEPARTMENT_OTHER)
Admission: RE | Disposition: A | Discharge status: Home / Self Care | Payer: Self-pay | Source: Home / Self Care | Attending: Plastic Surgery

## 2021-02-19 ENCOUNTER — Encounter (HOSPITAL_BASED_OUTPATIENT_CLINIC_OR_DEPARTMENT_OTHER): Payer: Self-pay | Admitting: Plastic Surgery

## 2021-02-19 ENCOUNTER — Ambulatory Visit (HOSPITAL_BASED_OUTPATIENT_CLINIC_OR_DEPARTMENT_OTHER): Payer: Commercial Managed Care - PPO | Admitting: Anesthesiology

## 2021-02-19 ENCOUNTER — Ambulatory Visit (HOSPITAL_BASED_OUTPATIENT_CLINIC_OR_DEPARTMENT_OTHER)
Admission: RE | Admit: 2021-02-19 | Discharge: 2021-02-19 | Disposition: A | Discharge status: Home / Self Care | Payer: Commercial Managed Care - PPO | Attending: Plastic Surgery | Admitting: Plastic Surgery

## 2021-02-19 DIAGNOSIS — Z9013 Acquired absence of bilateral breasts and nipples: Secondary | ICD-10-CM | POA: Diagnosis not present

## 2021-02-19 DIAGNOSIS — Z853 Personal history of malignant neoplasm of breast: Secondary | ICD-10-CM | POA: Diagnosis not present

## 2021-02-19 DIAGNOSIS — Z421 Encounter for breast reconstruction following mastectomy: Secondary | ICD-10-CM | POA: Insufficient documentation

## 2021-02-19 HISTORY — PX: REMOVAL OF BILATERAL TISSUE EXPANDERS WITH PLACEMENT OF BILATERAL BREAST IMPLANTS: SHX6431

## 2021-02-19 SURGERY — REMOVAL, TISSUE EXPANDER, BREAST, BILATERAL, WITH BILATERAL IMPLANT IMPLANT INSERTION
Anesthesia: General | Site: Chest | Laterality: Bilateral

## 2021-02-19 MED ORDER — SODIUM CHLORIDE 0.9 % IV SOLN
INTRAVENOUS | Status: DC | PRN
  Administered 2021-02-19: 500 mL

## 2021-02-19 MED ORDER — GABAPENTIN 300 MG PO CAPS
ORAL_CAPSULE | ORAL | Status: AC
  Filled 2021-02-19: qty 1

## 2021-02-19 MED ORDER — CELECOXIB 200 MG PO CAPS
200.0000 mg | ORAL_CAPSULE | ORAL | Status: AC
  Administered 2021-02-19: 200 mg via ORAL

## 2021-02-19 MED ORDER — ACETAMINOPHEN 500 MG PO TABS
1000.0000 mg | ORAL_TABLET | ORAL | Status: AC
  Administered 2021-02-19: 1000 mg via ORAL

## 2021-02-19 MED ORDER — CEFAZOLIN SODIUM-DEXTROSE 2-4 GM/100ML-% IV SOLN
INTRAVENOUS | Status: AC
  Filled 2021-02-19: qty 100

## 2021-02-19 MED ORDER — PHENYLEPHRINE 40 MCG/ML (10ML) SYRINGE FOR IV PUSH (FOR BLOOD PRESSURE SUPPORT)
PREFILLED_SYRINGE | INTRAVENOUS | Status: AC
  Filled 2021-02-19: qty 10

## 2021-02-19 MED ORDER — SUCCINYLCHOLINE CHLORIDE 200 MG/10ML IV SOSY
PREFILLED_SYRINGE | INTRAVENOUS | Status: AC
  Filled 2021-02-19: qty 10

## 2021-02-19 MED ORDER — MIDAZOLAM HCL 5 MG/5ML IJ SOLN
INTRAMUSCULAR | Status: DC | PRN
  Administered 2021-02-19: 2 mg via INTRAVENOUS

## 2021-02-19 MED ORDER — DROPERIDOL 2.5 MG/ML IJ SOLN
INTRAMUSCULAR | Status: DC | PRN
  Administered 2021-02-19: .625 mg via INTRAVENOUS

## 2021-02-19 MED ORDER — DROPERIDOL 2.5 MG/ML IJ SOLN
INTRAMUSCULAR | Status: AC
  Filled 2021-02-19: qty 2

## 2021-02-19 MED ORDER — EPHEDRINE 5 MG/ML INJ
INTRAVENOUS | Status: AC
  Filled 2021-02-19: qty 10

## 2021-02-19 MED ORDER — ONDANSETRON HCL 4 MG/2ML IJ SOLN
INTRAMUSCULAR | Status: AC
  Filled 2021-02-19: qty 2

## 2021-02-19 MED ORDER — SUFENTANIL CITRATE 50 MCG/ML IV SOLN
INTRAVENOUS | Status: AC
  Filled 2021-02-19: qty 1

## 2021-02-19 MED ORDER — ONDANSETRON HCL 4 MG/2ML IJ SOLN
INTRAMUSCULAR | Status: DC | PRN
  Administered 2021-02-19: 4 mg via INTRAVENOUS

## 2021-02-19 MED ORDER — GABAPENTIN 300 MG PO CAPS
300.0000 mg | ORAL_CAPSULE | ORAL | Status: AC
  Administered 2021-02-19: 300 mg via ORAL

## 2021-02-19 MED ORDER — SUFENTANIL CITRATE 50 MCG/ML IV SOLN
INTRAVENOUS | Status: DC | PRN
  Administered 2021-02-19: 10 ug via INTRAVENOUS

## 2021-02-19 MED ORDER — CHLORHEXIDINE GLUCONATE CLOTH 2 % EX PADS: 6.0000 | MEDICATED_PAD | Freq: Once | CUTANEOUS | Status: DC

## 2021-02-19 MED ORDER — SUGAMMADEX SODIUM 200 MG/2ML IV SOLN
INTRAVENOUS | Status: DC | PRN
  Administered 2021-02-19: 200 mg via INTRAVENOUS

## 2021-02-19 MED ORDER — LACTATED RINGERS IV SOLN: INTRAVENOUS | Status: DC

## 2021-02-19 MED ORDER — CEFAZOLIN SODIUM-DEXTROSE 2-4 GM/100ML-% IV SOLN
2.0000 g | INTRAVENOUS | Status: AC
  Administered 2021-02-19: 2 g via INTRAVENOUS

## 2021-02-19 MED ORDER — PROPOFOL 10 MG/ML IV BOLUS
INTRAVENOUS | Status: DC | PRN
  Administered 2021-02-19: 150 mg via INTRAVENOUS

## 2021-02-19 MED ORDER — LIDOCAINE HCL (CARDIAC) PF 100 MG/5ML IV SOSY
PREFILLED_SYRINGE | INTRAVENOUS | Status: DC | PRN
  Administered 2021-02-19: 50 mg via INTRAVENOUS

## 2021-02-19 MED ORDER — SUCCINYLCHOLINE CHLORIDE 20 MG/ML IJ SOLN
INTRAMUSCULAR | Status: DC | PRN
  Administered 2021-02-19: 140 mg via INTRAVENOUS

## 2021-02-19 MED ORDER — HYDROMORPHONE HCL 1 MG/ML IJ SOLN: 0.2500 mg | INTRAMUSCULAR | Status: DC | PRN

## 2021-02-19 MED ORDER — CELECOXIB 200 MG PO CAPS
ORAL_CAPSULE | ORAL | Status: AC
  Filled 2021-02-19: qty 1

## 2021-02-19 MED ORDER — LIDOCAINE HCL (PF) 2 % IJ SOLN
INTRAMUSCULAR | Status: AC
  Filled 2021-02-19: qty 5

## 2021-02-19 MED ORDER — BUPIVACAINE HCL 0.5 % IJ SOLN
INTRAMUSCULAR | Status: DC | PRN
  Administered 2021-02-19 (×2): 15 mL

## 2021-02-19 MED ORDER — DEXAMETHASONE SODIUM PHOSPHATE 10 MG/ML IJ SOLN
INTRAMUSCULAR | Status: AC
  Filled 2021-02-19: qty 1

## 2021-02-19 MED ORDER — ACETAMINOPHEN 500 MG PO TABS
ORAL_TABLET | ORAL | Status: AC
  Filled 2021-02-19: qty 2

## 2021-02-19 MED ORDER — MIDAZOLAM HCL 2 MG/2ML IJ SOLN
INTRAMUSCULAR | Status: AC
  Filled 2021-02-19: qty 2

## 2021-02-19 MED ORDER — DEXAMETHASONE SODIUM PHOSPHATE 4 MG/ML IJ SOLN
INTRAMUSCULAR | Status: DC | PRN
  Administered 2021-02-19: 10 mg via INTRAVENOUS

## 2021-02-19 MED ORDER — EPHEDRINE SULFATE 50 MG/ML IJ SOLN
INTRAMUSCULAR | Status: DC | PRN
  Administered 2021-02-19: 10 mg via INTRAVENOUS

## 2021-02-19 MED ORDER — ROCURONIUM BROMIDE 10 MG/ML (PF) SYRINGE
PREFILLED_SYRINGE | INTRAVENOUS | Status: AC
  Filled 2021-02-19: qty 10

## 2021-02-19 MED ORDER — ROCURONIUM BROMIDE 100 MG/10ML IV SOLN
INTRAVENOUS | Status: DC | PRN
  Administered 2021-02-19: 30 mg via INTRAVENOUS

## 2021-02-19 SURGICAL SUPPLY — 65 items
ADH SKN CLS APL DERMABOND .7 (GAUZE/BANDAGES/DRESSINGS) ×2
APL PRP STRL LF DISP 70% ISPRP (MISCELLANEOUS) ×2
BAG DECANTER FOR FLEXI CONT (MISCELLANEOUS) ×3 IMPLANT
BINDER BREAST 3XL (GAUZE/BANDAGES/DRESSINGS) IMPLANT
BINDER BREAST LRG (GAUZE/BANDAGES/DRESSINGS) IMPLANT
BINDER BREAST MEDIUM (GAUZE/BANDAGES/DRESSINGS) ×3 IMPLANT
BINDER BREAST XLRG (GAUZE/BANDAGES/DRESSINGS) IMPLANT
BINDER BREAST XXLRG (GAUZE/BANDAGES/DRESSINGS) IMPLANT
BLADE SURG 10 STRL SS (BLADE) ×6 IMPLANT
BNDG GAUZE ELAST 4 BULKY (GAUZE/BANDAGES/DRESSINGS) ×6 IMPLANT
CANISTER SUCT 1200ML W/VALVE (MISCELLANEOUS) ×3 IMPLANT
CHLORAPREP W/TINT 26 (MISCELLANEOUS) ×5 IMPLANT
COVER BACK TABLE 60X90IN (DRAPES) ×3 IMPLANT
COVER MAYO STAND STRL (DRAPES) ×3 IMPLANT
DECANTER SPIKE VIAL GLASS SM (MISCELLANEOUS) IMPLANT
DERMABOND ADVANCED (GAUZE/BANDAGES/DRESSINGS) ×4
DERMABOND ADVANCED .7 DNX12 (GAUZE/BANDAGES/DRESSINGS) ×2 IMPLANT
DRAIN CHANNEL 15F RND FF W/TCR (WOUND CARE) IMPLANT
DRAPE TOP ARMCOVERS (MISCELLANEOUS) ×3 IMPLANT
DRAPE U-SHAPE 76X120 STRL (DRAPES) ×3 IMPLANT
DRAPE UTILITY XL STRL (DRAPES) ×3 IMPLANT
DRSG PAD ABDOMINAL 8X10 ST (GAUZE/BANDAGES/DRESSINGS) ×6 IMPLANT
ELECT BLADE 4.0 EZ CLEAN MEGAD (MISCELLANEOUS) ×3
ELECT COATED BLADE 2.86 ST (ELECTRODE) ×3 IMPLANT
ELECT REM PT RETURN 9FT ADLT (ELECTROSURGICAL) ×3
ELECTRODE BLDE 4.0 EZ CLN MEGD (MISCELLANEOUS) ×1 IMPLANT
ELECTRODE REM PT RTRN 9FT ADLT (ELECTROSURGICAL) ×1 IMPLANT
EVACUATOR SILICONE 100CC (DRAIN) IMPLANT
GLOVE SURG HYDRASOFT LTX SZ5.5 (GLOVE) ×8 IMPLANT
GLOVE SURG POLYISO LF SZ7 (GLOVE) ×2 IMPLANT
GLOVE SURG UNDER POLY LF SZ7 (GLOVE) ×2 IMPLANT
GOWN STRL REUS W/ TWL LRG LVL3 (GOWN DISPOSABLE) ×2 IMPLANT
GOWN STRL REUS W/TWL LRG LVL3 (GOWN DISPOSABLE) ×6
IMPL BREAST GEL 365CC (Breast) ×2 IMPLANT
IMPLANT BREAST GEL 365CC (Breast) ×6 IMPLANT
KIT FILL SYSTEM UNIVERSAL (SET/KITS/TRAYS/PACK) IMPLANT
MARKER SKIN DUAL TIP RULER LAB (MISCELLANEOUS) IMPLANT
NEEDLE FILTER BLUNT 18X 1/2SAF (NEEDLE) ×2
NEEDLE FILTER BLUNT 18X1 1/2 (NEEDLE) ×1 IMPLANT
NEEDLE HYPO 25X1 1.5 SAFETY (NEEDLE) ×3 IMPLANT
PACK BASIN DAY SURGERY FS (CUSTOM PROCEDURE TRAY) ×3 IMPLANT
PENCIL SMOKE EVACUATOR (MISCELLANEOUS) ×3 IMPLANT
PIN SAFETY STERILE (MISCELLANEOUS) IMPLANT
SHEET MEDIUM DRAPE 40X70 STRL (DRAPES) ×3 IMPLANT
SIZER BREAST REUSE 365CC (SIZER) ×3
SIZER BRST REUSE 365CC (SIZER) IMPLANT
SLEEVE SCD COMPRESS KNEE MED (STOCKING) ×3 IMPLANT
SPONGE T-LAP 18X18 ~~LOC~~+RFID (SPONGE) ×6 IMPLANT
STAPLER VISISTAT 35W (STAPLE) ×3 IMPLANT
SUT ETHILON 2 0 FS 18 (SUTURE) IMPLANT
SUT MNCRL AB 4-0 PS2 18 (SUTURE) ×4 IMPLANT
SUT PDS AB 2-0 CT2 27 (SUTURE) IMPLANT
SUT VIC AB 3-0 PS1 18 (SUTURE)
SUT VIC AB 3-0 PS1 18XBRD (SUTURE) IMPLANT
SUT VIC AB 3-0 SH 27 (SUTURE) ×6
SUT VIC AB 3-0 SH 27X BRD (SUTURE) ×2 IMPLANT
SUT VICRYL 4-0 PS2 18IN ABS (SUTURE) ×4 IMPLANT
SYR 20ML LL LF (SYRINGE) IMPLANT
SYR BULB IRRIG 60ML STRL (SYRINGE) ×6 IMPLANT
SYR CONTROL 10ML LL (SYRINGE) IMPLANT
TOWEL GREEN STERILE FF (TOWEL DISPOSABLE) ×6 IMPLANT
TUBE CONNECTING 20'X1/4 (TUBING) ×1
TUBE CONNECTING 20X1/4 (TUBING) ×2 IMPLANT
UNDERPAD 30X36 HEAVY ABSORB (UNDERPADS AND DIAPERS) ×6 IMPLANT
YANKAUER SUCT BULB TIP NO VENT (SUCTIONS) ×3 IMPLANT

## 2021-02-19 NOTE — Transfer of Care (Signed)
Immediate Anesthesia Transfer of Care Note  Patient: Danielle Camacho  Procedure(s) Performed: REMOVAL OF BILATERAL TISSUE EXPANDERS WITH PLACEMENT OF BILATERAL BREAST SILICONE IMPLANTS (Bilateral: Chest)  Patient Location: PACU  Anesthesia Type:General  Level of Consciousness: drowsy and patient cooperative  Airway & Oxygen Therapy: Patient Spontanous Breathing and Patient connected to face mask oxygen  Post-op Assessment: Report given to RN and Post -op Vital signs reviewed and stable  Post vital signs: Reviewed and stable  Last Vitals:  Vitals Value Taken Time  BP    Temp    Pulse 69 02/19/21 0909  Resp    SpO2 100 % 02/19/21 0909  Vitals shown include unvalidated device data.  Last Pain:  Vitals:   02/19/21 0628  TempSrc: Oral  PainSc: 0-No pain         Complications: No notable events documented.

## 2021-02-19 NOTE — Op Note (Signed)
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   EBL: 20 ml  COMPLICATIONS: None immediate.   INDICATIONS FOR PROCEDURE:  The patient, Danielle Camacho, is a 55 y.o. female born on 1966/05/25, is here for staged breast reconstruction following bilateral skin reduction pattern mastectomies with prepectoral expander acellular dermis reconstruction.   FINDINGS: Complete incorporation ADM noted bilateral. Natrelle Soft Touch Smooth Round Full Projection 365 ml implants placed bilateral. REF SSF-365 RIGHT SN [id number] LEF SN [id number]  DESCRIPTION OF PROCEDURE:  The patient's operative site was marked in the preoperative area. The patient was taken to the operating room. SCDs were placed and IV antibiotics were given. The patient's operative site was prepped and draped in a sterile fashion. A time out was performed and all information was confirmed to be correct. I began on left side. Incision made through prior inframammary fold scar and carried through superficial fascia to acellular demis. ADM incised. Expander removed. Full incorporation ADM noted. Sizer placed.   I then directed attention to right chest. Incision made in prior inframammary fold scar and implant cavity entered in similar manner. Expander removed and well incorporated ADM noted. Sizer placed. Patient brought to upright sitting position. Natrelle Smooth Round Full Projection 365 ml implant selected for bilateral placement. Patient returned to supine position.   Each cavity irrigated with saline solution containing Betadine, Ancef, gentamicin solution. Hemostasis ensured.The implant  was placed in left chest and implant orientation ensured. Closure completed with 3-0 vicryl to close superficial fascia and ADM over implant. 4-0 vicryl used to close dermis followed by 4-0 monocryl subcuticular. Implant placed in right chest cavity. Closure completed in similar fashion. Dermabond applied to chest incisions. Dry dressing applied, followed by breast binder.  The patient was allowed to wake from anesthesia, extubated and taken to the recovery room in satisfactory condition.   SPECIMENS: none  DRAINS: none

## 2021-02-19 NOTE — Anesthesia Procedure Notes (Signed)
Procedure Name: Intubation Date/Time: 02/19/2021 7:30 AM Performed by: Willa Frater, CRNA Pre-anesthesia Checklist: Patient identified, Emergency Drugs available, Suction available and Patient being monitored Patient Re-evaluated:Patient Re-evaluated prior to induction Oxygen Delivery Method: Circle system utilized Preoxygenation: Pre-oxygenation with 100% oxygen Induction Type: IV induction Ventilation: Mask ventilation without difficulty Laryngoscope Size: Mac and 3 Grade View: Grade I Tube type: Oral Number of attempts: 1 Airway Equipment and Method: Stylet and Oral airway Placement Confirmation: ETT inserted through vocal cords under direct vision, positive ETCO2 and breath sounds checked- equal and bilateral Secured at: 22 cm Tube secured with: Tape Dental Injury: Teeth and Oropharynx as per pre-operative assessment

## 2021-02-19 NOTE — Discharge Instructions (Signed)
May take Tylenol after 12:30pm, if needed.  May take NSAIDS (Motrin, Ibuprofen) after 12:30pm, if needed.    Post Anesthesia Home Care Instructions  Activity: Get plenty of rest for the remainder of the day. A responsible individual must stay with you for 24 hours following the procedure.  For the next 24 hours, DO NOT: -Drive a car -Paediatric nurse -Drink alcoholic beverages -Take any medication unless instructed by your physician -Make any legal decisions or sign important papers.  Meals: Start with liquid foods such as gelatin or soup. Progress to regular foods as tolerated. Avoid greasy, spicy, heavy foods. If nausea and/or vomiting occur, drink only clear liquids until the nausea and/or vomiting subsides. Call your physician if vomiting continues.  Special Instructions/Symptoms: Your throat may feel dry or sore from the anesthesia or the breathing tube placed in your throat during surgery. If this causes discomfort, gargle with warm salt water. The discomfort should disappear within 24 hours.  If you had a scopolamine patch placed behind your ear for the management of post- operative nausea and/or vomiting:  1. The medication in the patch is effective for 72 hours, after which it should be removed.  Wrap patch in a tissue and discard in the trash. Wash hands thoroughly with soap and water. 2. You may remove the patch earlier than 72 hours if you experience unpleasant side effects which may include dry mouth, dizziness or visual disturbances. 3. Avoid touching the patch. Wash your hands with soap and water after contact with the patch.

## 2021-02-19 NOTE — Anesthesia Postprocedure Evaluation (Signed)
Anesthesia Post Note  Patient: Danielle Camacho  Procedure(s) Performed: REMOVAL OF BILATERAL TISSUE EXPANDERS WITH PLACEMENT OF BILATERAL BREAST SILICONE IMPLANTS (Bilateral: Chest)     Patient location during evaluation: PACU Anesthesia Type: General Level of consciousness: awake Pain management: pain level controlled Vital Signs Assessment: post-procedure vital signs reviewed and stable Respiratory status: spontaneous breathing Cardiovascular status: stable Postop Assessment: no apparent nausea or vomiting Anesthetic complications: no   No notable events documented.  Last Vitals:  Vitals:   02/19/21 0930 02/19/21 0951  BP: (!) 132/58 137/75  Pulse: 71 75  Resp: 13 14  Temp:  36.5 C  SpO2: 97% 98%    Last Pain:  Vitals:   02/19/21 0951  TempSrc:   PainSc: 0-No pain                 Arieona Swaggerty

## 2021-02-19 NOTE — Interval H&P Note (Signed)
History and Physical Interval Note:  02/19/2021 6:55 AM  Carolee Rota  has presented today for surgery, with the diagnosis of hx DCIS, acquired absence breasts.  The various methods of treatment have been discussed with the patient and family. After consideration of risks, benefits and other options for treatment, the patient has consented to  Procedure(s): REMOVAL OF BILATERAL TISSUE EXPANDERS WITH PLACEMENT OF BILATERAL BREAST SILICONE IMPLANTS (Bilateral) as a surgical intervention.  The patient's history has been reviewed, patient examined, no change in status, stable for surgery.  I have reviewed the patient's chart and labs.  Questions were answered to the patient's satisfaction.     Arnoldo Hooker Kylin Dubs

## 2021-02-19 NOTE — Anesthesia Postprocedure Evaluation (Signed)
Anesthesia Post Note  Patient: Danielle Camacho  Procedure(s) Performed: REMOVAL OF BILATERAL TISSUE EXPANDERS WITH PLACEMENT OF BILATERAL BREAST SILICONE IMPLANTS (Bilateral: Chest)     Patient location during evaluation: PACU Anesthesia Type: General Level of consciousness: awake Pain management: pain level controlled Vital Signs Assessment: post-procedure vital signs reviewed and stable Respiratory status: spontaneous breathing Cardiovascular status: stable Postop Assessment: no apparent nausea or vomiting Anesthetic complications: no   No notable events documented.  Last Vitals:  Vitals:   02/19/21 0930 02/19/21 0951  BP: (!) 132/58 137/75  Pulse: 71 75  Resp: 13 14  Temp:  36.5 C  SpO2: 97% 98%    Last Pain:  Vitals:   02/19/21 0951  TempSrc:   PainSc: 0-No pain                 Nkenge Sonntag

## 2021-02-21 ENCOUNTER — Encounter (HOSPITAL_BASED_OUTPATIENT_CLINIC_OR_DEPARTMENT_OTHER): Payer: Self-pay | Admitting: Plastic Surgery

## 2021-02-23 NOTE — Addendum Note (Signed)
Addendum  created 02/23/21 1259 by Jadan Rouillard, Ernesta Amble, CRNA   Charge Capture section accepted

## 2021-03-15 ENCOUNTER — Other Ambulatory Visit: Payer: Self-pay

## 2021-03-15 ENCOUNTER — Ambulatory Visit: Payer: Commercial Managed Care - PPO | Attending: General Surgery

## 2021-03-15 VITALS — Wt 123.4 lb

## 2021-03-15 DIAGNOSIS — Z483 Aftercare following surgery for neoplasm: Secondary | ICD-10-CM | POA: Insufficient documentation

## 2021-03-15 NOTE — Therapy (Signed)
[place], [place], [address] Phone: [phone]   Fax:  [phone]  Physical Therapy Treatment  Patient Details  Name: Danielle Camacho MRN: [id number] Date of Birth: 11-26-1965 Referring Provider (PT): Thimmappa   Encounter Date: 03/15/2021   PT End of Session - 03/15/21 1556     Visit Number 2   # unchanged due to screen only   PT Start Time 1547    PT Stop Time 1556    PT Time Calculation (min) 9 min    Activity Tolerance Patient tolerated treatment well    Behavior During Therapy [facility] for tasks assessed/performed             Past Medical History:  Diagnosis Date   Cancer ([place])    DCIS-left side Breast Cancer   Depression     Past Surgical History:  Procedure Laterality Date   ABDOMINAL HYSTERECTOMY     BREAST RECONSTRUCTION WITH PLACEMENT OF TISSUE EXPANDER AND ALLODERM Bilateral 11/26/2020   Procedure: BILATERAL BREAST RECONSTRUCTION WITH PLACEMENT OF TISSUE EXPANDER AND ALLODERM;  Surgeon: Irene Limbo, MD;  Location: [place];  Service: Plastics;  Laterality: Bilateral;   CHOLECYSTECTOMY     MASTECTOMY W/ SENTINEL NODE BIOPSY Left 11/26/2020   Procedure: LEFT MASTECTOMY WITH LEFT AXILLARY SENTINEL LYMPH NODE BIOPSY;  Surgeon: Rolm Bookbinder, MD;  Location: [place];  Service: General;  Laterality: Left;   REMOVAL OF BILATERAL TISSUE EXPANDERS WITH PLACEMENT OF BILATERAL BREAST IMPLANTS Bilateral 02/19/2021   Procedure: REMOVAL OF BILATERAL TISSUE EXPANDERS WITH PLACEMENT OF BILATERAL BREAST SILICONE IMPLANTS;  Surgeon: Irene Limbo, MD;  Location: [facility];  Service: Plastics;  Laterality: Bilateral;   TONSILLECTOMY     TOTAL MASTECTOMY Right 11/26/2020   Procedure: RIGHT RISK REDUCING MASTECTOMY;  Surgeon: Rolm Bookbinder, MD;  Location: [place];  Service: General;  Laterality: Right;    Vitals:   03/15/21 1550  Weight: 123 lb 6 oz (56 kg)     Subjective  Assessment - 03/15/21 1550     Subjective Pt returns for her 3 month L-Dex screen.    Pertinent History No pertinent information in chart yet.  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                    L-DEX FLOWSHEETS - 03/15/21 1500       L-DEX LYMPHEDEMA SCREENING   Measurement Type Unilateral    L-DEX MEASUREMENT EXTREMITY Upper Extremity    POSITION  Standing    DOMINANT SIDE Right    At Risk Side Left    BASELINE SCORE (UNILATERAL) -1.6    L-DEX SCORE (UNILATERAL) 0.7    VALUE CHANGE (UNILAT) 2.3                                    PT Long Term Goals - 12/23/20 1447       PT LONG TERM GOAL #1   Title Pts post surgical shoulder ROM will return to pre-surgical ROM and function    Time 8    Period Weeks    Status On-going  Plan - 03/15/21 1556     Clinical Impression Statement Pt returns for her 3 month L-Dex screen. Her change from baseline of 2.3 is WNLs so no further treatment is required at this time except to cont every 3 month L-Dex screens which pt is agreeable to.    PT Next Visit Plan Cont every 3 month L-dex screens for up to 2 years from her SLNB.    Consulted and Agree with Plan of Care Patient             Patient will benefit from skilled therapeutic intervention in order to improve the following deficits and impairments:     Visit Diagnosis: Aftercare following surgery for neoplasm     Problem List Patient Active Problem List   Diagnosis Date Noted   Breast neoplasm, Tis (DCIS), left 11/27/2020    Otelia Limes, PTA 03/15/2021, 3:59 PM  [place], [place], [address] Phone: [phone]    Fax:  [phone]  Name: SHANTARA ZWOLINSKI MRN: [id number] Date of Birth: 1965-11-22

## 2021-06-14 ENCOUNTER — Ambulatory Visit: Payer: Commercial Managed Care - PPO | Attending: General Surgery

## 2021-06-14 ENCOUNTER — Other Ambulatory Visit: Payer: Self-pay

## 2021-06-14 VITALS — Wt 128.0 lb

## 2021-06-14 DIAGNOSIS — Z483 Aftercare following surgery for neoplasm: Secondary | ICD-10-CM | POA: Insufficient documentation

## 2021-06-14 NOTE — Therapy (Signed)
[facility] @ [place], [place], [address] Phone: [phone]   Fax:  [phone]  Physical Therapy Treatment  Patient Details  Name: Danielle Camacho MRN: [id number] Date of Birth: 10/06/1965 Referring Provider (PT): Thimmappa   Encounter Date: 06/14/2021   PT End of Session - 06/14/21 1646     Visit Number 2   # unchanged due to screen only   PT Start Time 1642    PT Stop Time 1649    PT Time Calculation (min) 7 min    Activity Tolerance Patient tolerated treatment well    Behavior During Therapy [facility] for tasks assessed/performed             Past Medical History:  Diagnosis Date   Cancer ([place])    DCIS-left side Breast Cancer   Depression     Past Surgical History:  Procedure Laterality Date   ABDOMINAL HYSTERECTOMY     BREAST RECONSTRUCTION WITH PLACEMENT OF TISSUE EXPANDER AND ALLODERM Bilateral 11/26/2020   Procedure: BILATERAL BREAST RECONSTRUCTION WITH PLACEMENT OF TISSUE EXPANDER AND ALLODERM;  Surgeon: Irene Limbo, MD;  Location: [facility];  Service: Plastics;  Laterality: Bilateral;   CHOLECYSTECTOMY     COLONOSCOPY  10/09/2017   Mild sigmoid diverticulosis. Otherwise normal colonoscopy   MASTECTOMY W/ SENTINEL NODE BIOPSY Left 11/26/2020   Procedure: LEFT MASTECTOMY WITH LEFT AXILLARY SENTINEL LYMPH NODE BIOPSY;  Surgeon: Rolm Bookbinder, MD;  Location: [facility];  Service: General;  Laterality: Left;   REMOVAL OF BILATERAL TISSUE EXPANDERS WITH PLACEMENT OF BILATERAL BREAST IMPLANTS Bilateral 02/19/2021   Procedure: REMOVAL OF BILATERAL TISSUE EXPANDERS WITH PLACEMENT OF BILATERAL BREAST SILICONE IMPLANTS;  Surgeon: Irene Limbo, MD;  Location: [place];  Service: Plastics;  Laterality: Bilateral;   TONSILLECTOMY     TOTAL MASTECTOMY Right 11/26/2020   Procedure: RIGHT RISK REDUCING MASTECTOMY;  Surgeon: Rolm Bookbinder, MD;  Location: [facility];  Service: General;   Laterality: Right;    Vitals:   06/14/21 1645  Weight: 128 lb (58.1 kg)     Subjective Assessment - 06/14/21 1645     Subjective Pt returns for her 3 month L-Dex screen.    Pertinent History No pertinent information in chart yet.  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                    L-DEX FLOWSHEETS - 06/14/21 1600       L-DEX LYMPHEDEMA SCREENING   Measurement Type Unilateral    L-DEX MEASUREMENT EXTREMITY Upper Extremity    POSITION  Standing    DOMINANT SIDE Right    At Risk Side Left    BASELINE SCORE (UNILATERAL) -1.6    L-DEX SCORE (UNILATERAL) 0    VALUE CHANGE (UNILAT) 1.6                                     PT Long Term Goals - 12/23/20 1447       PT LONG TERM GOAL #1   Title Pts post surgical shoulder ROM will return to pre-surgical ROM and function    Time 8    Period Weeks    Status On-going  Plan - 06/14/21 1649     Clinical Impression Statement Pt returns for her 3 month L-Dex screen. Her change from baseline of 1.6 is WNLs so no further treatment is required at this time except to cont every 3 month L-Dex screens which pt is agreeable to.    PT Next Visit Plan Cont every 3 month L-dex screens for up to 2 years from her SLNB (~11/27/22)    Consulted and Agree with Plan of Care Patient             Patient will benefit from skilled therapeutic intervention in order to improve the following deficits and impairments:     Visit Diagnosis: Aftercare following surgery for neoplasm     Problem List Patient Active Problem List   Diagnosis Date Noted   Breast neoplasm, Tis (DCIS), left 11/27/2020    Otelia Limes, PTA 06/14/2021, 4:51 PM  [facility] @ [place], [place], [address] Phone: [phone]   Fax:  [phone]  Name: Danielle Camacho MRN: [id number] Date of Birth: [birth year]

## 2021-08-30 ENCOUNTER — Ambulatory Visit: Payer: Commercial Managed Care - PPO | Attending: General Surgery

## 2021-08-30 ENCOUNTER — Other Ambulatory Visit: Payer: Self-pay

## 2021-08-30 VITALS — Wt 129.5 lb

## 2021-08-30 DIAGNOSIS — Z483 Aftercare following surgery for neoplasm: Secondary | ICD-10-CM | POA: Insufficient documentation

## 2021-08-30 NOTE — Therapy (Signed)
[facility] @ [place], [place], [address] Phone: [phone]   Fax:  [phone]  Physical Therapy Treatment  Patient Details  Name: Danielle Camacho MRN: [id number] Date of Birth: [birth year] Referring Provider (PT): Thimmappa   Encounter Date: 08/30/2021   PT End of Session - 08/30/21 1649     Visit Number 2   # unchanged due to screen only   PT Start Time 1648    PT Stop Time 1654    PT Time Calculation (min) 6 min    Activity Tolerance Patient tolerated treatment well    Behavior During Therapy [facility] for tasks assessed/performed             Past Medical History:  Diagnosis Date   Cancer ([place])    DCIS-left side Breast Cancer   Depression     Past Surgical History:  Procedure Laterality Date   ABDOMINAL HYSTERECTOMY     BREAST RECONSTRUCTION WITH PLACEMENT OF TISSUE EXPANDER AND ALLODERM Bilateral 11/26/2020   Procedure: BILATERAL BREAST RECONSTRUCTION WITH PLACEMENT OF TISSUE EXPANDER AND ALLODERM;  Surgeon: Irene Limbo, MD;  Location: [facility];  Service: Plastics;  Laterality: Bilateral;   CHOLECYSTECTOMY     COLONOSCOPY  10/09/2017   Mild sigmoid diverticulosis. Otherwise normal colonoscopy   MASTECTOMY W/ SENTINEL NODE BIOPSY Left 11/26/2020   Procedure: LEFT MASTECTOMY WITH LEFT AXILLARY SENTINEL LYMPH NODE BIOPSY;  Surgeon: Rolm Bookbinder, MD;  Location: [place];  Service: General;  Laterality: Left;   REMOVAL OF BILATERAL TISSUE EXPANDERS WITH PLACEMENT OF BILATERAL BREAST IMPLANTS Bilateral 02/19/2021   Procedure: REMOVAL OF BILATERAL TISSUE EXPANDERS WITH PLACEMENT OF BILATERAL BREAST SILICONE IMPLANTS;  Surgeon: Irene Limbo, MD;  Location: [facility];  Service: Plastics;  Laterality: Bilateral;   TONSILLECTOMY     TOTAL MASTECTOMY Right 11/26/2020   Procedure: RIGHT RISK REDUCING MASTECTOMY;  Surgeon: Rolm Bookbinder, MD;  Location: [facility];  Service: General;   Laterality: Right;    Vitals:   08/30/21 1651  Weight: 129 lb 8 oz (58.7 kg)     Subjective Assessment - 08/30/21 1649     Subjective Pt returns for her 3 month L-Dex screen.    Pertinent History No pertinent information in chart yet.  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                    L-DEX FLOWSHEETS - 08/30/21 1600       L-DEX LYMPHEDEMA SCREENING   Measurement Type Unilateral    L-DEX MEASUREMENT EXTREMITY Upper Extremity    POSITION  Standing    DOMINANT SIDE Right    At Risk Side Left    BASELINE SCORE (UNILATERAL) -1.6    L-DEX SCORE (UNILATERAL) -1.5    VALUE CHANGE (UNILAT) 0.1                                     PT Long Term Goals - 12/23/20 1447       PT LONG TERM GOAL #1   Title Pts post surgical shoulder ROM will return to pre-surgical ROM and function    Time 8    Period Weeks    Status On-going  Plan - 08/30/21 1650     Clinical Impression Statement Pt returns for her 3 month L-Dex screen. Her change from baseline of 0.1 is WNLs so no further treament is required at this time except to cont every 3 month L-Dex screens which pt is agreeable to.    PT Next Visit Plan Cont every 3 month L-dex screens for up to 2 years from her SLNB (~11/27/22)    Consulted and Agree with Plan of Care Patient             Patient will benefit from skilled therapeutic intervention in order to improve the following deficits and impairments:     Visit Diagnosis: Aftercare following surgery for neoplasm     Problem List Patient Active Problem List   Diagnosis Date Noted   Breast neoplasm, Tis (DCIS), left 11/27/2020    Otelia Limes, PTA 08/30/2021, 4:54 PM  [facility] @ [place], [place], [address] Phone: [phone]   Fax:  [phone]  Name: Danielle Camacho MRN: [id number] Date of Birth: [birth year]

## 2021-11-29 ENCOUNTER — Ambulatory Visit: Payer: Commercial Managed Care - PPO | Attending: General Surgery

## 2021-11-29 VITALS — Wt 126.2 lb

## 2021-11-29 DIAGNOSIS — Z483 Aftercare following surgery for neoplasm: Secondary | ICD-10-CM | POA: Insufficient documentation

## 2021-11-29 NOTE — Therapy (Signed)
?  OUTPATIENT PHYSICAL THERAPY SOZO SCREENING NOTE ? ? ?Patient Name: Danielle Camacho ?MRN: [id number] ?DOB:31-Jul-1966, 56 y.o., female ?Today's Date: 11/29/2021 ? ?PCP: Ronita Hipps, MD ?REFERRING PROVIDER: Rolm Bookbinder, MD ? ? PT End of Session - 11/29/21 1642   ? ? Visit Number 2   # unchanged due to screen only  ? PT Start Time 2119   ? PT Stop Time 4174   ? PT Time Calculation (min) 5 min   ? Activity Tolerance Patient tolerated treatment well   ? Behavior During Therapy [facility] for tasks assessed/performed   ? ?  ?  ? ?  ? ? ?Past Medical History:  ?Diagnosis Date  ? Cancer [facility])   ? DCIS-left side Breast Cancer  ? Depression   ? ?Past Surgical History:  ?Procedure Laterality Date  ? ABDOMINAL HYSTERECTOMY    ? BREAST RECONSTRUCTION WITH PLACEMENT OF TISSUE EXPANDER AND ALLODERM Bilateral 11/26/2020  ? Procedure: BILATERAL BREAST RECONSTRUCTION WITH PLACEMENT OF TISSUE EXPANDER AND ALLODERM;  Surgeon: Irene Limbo, MD;  Location: [place];  Service: Plastics;  Laterality: Bilateral;  ? CHOLECYSTECTOMY    ? COLONOSCOPY  10/09/2017  ? Mild sigmoid diverticulosis. Otherwise normal colonoscopy  ? MASTECTOMY W/ SENTINEL NODE BIOPSY Left 11/26/2020  ? Procedure: LEFT MASTECTOMY WITH LEFT AXILLARY SENTINEL LYMPH NODE BIOPSY;  Surgeon: Rolm Bookbinder, MD;  Location: [place];  Service: General;  Laterality: Left;  ? REMOVAL OF BILATERAL TISSUE EXPANDERS WITH PLACEMENT OF BILATERAL BREAST IMPLANTS Bilateral 02/19/2021  ? Procedure: REMOVAL OF BILATERAL TISSUE EXPANDERS WITH PLACEMENT OF BILATERAL BREAST SILICONE IMPLANTS;  Surgeon: Irene Limbo, MD;  Location: [place];  Service: Plastics;  Laterality: Bilateral;  ? TONSILLECTOMY    ? TOTAL MASTECTOMY Right 11/26/2020  ? Procedure: RIGHT RISK REDUCING MASTECTOMY;  Surgeon: Rolm Bookbinder, MD;  Location: [place];  Service: General;  Laterality: Right;  ? ?Patient Active Problem List  ? Diagnosis Date Noted  ? Breast neoplasm, Tis (DCIS), left  11/27/2020  ? ? ?REFERRING DIAG: left breast cancer at risk for lymphedema ? ?THERAPY DIAG:  ?Aftercare following surgery for neoplasm ? 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 ?PRECAUTIONS: left UE Lymphedema risk, None ? ?SUBJECTIVE: Pt returns for her 3 month L-Dex screen.  ? ?PAIN:  ?Are you having pain? No ? ?SOZO SCREENING: ?Patient was assessed today using the SOZO machine to determine the lymphedema index score. This was compared to her baseline score. It was determined that she is within the recommended range when compared to her baseline and no further action is needed at this time. She will continue SOZO screenings. These are done every 3 months for 2 years post operatively followed by every 6 months for 2 years, and then annually. ? ? ? ?Otelia Limes, PTA ?11/29/2021, 4:43 PM ? ?  ? ?

## 2022-04-18 ENCOUNTER — Ambulatory Visit: Payer: Commercial Managed Care - PPO

## 2022-05-02 ENCOUNTER — Ambulatory Visit: Payer: Commercial Managed Care - PPO | Attending: General Surgery

## 2022-05-02 VITALS — Wt 124.0 lb

## 2022-05-02 DIAGNOSIS — M79622 Pain in left upper arm: Secondary | ICD-10-CM | POA: Insufficient documentation

## 2022-05-02 DIAGNOSIS — Z483 Aftercare following surgery for neoplasm: Secondary | ICD-10-CM | POA: Insufficient documentation

## 2022-05-02 DIAGNOSIS — D0512 Intraductal carcinoma in situ of left breast: Secondary | ICD-10-CM | POA: Insufficient documentation

## 2022-05-02 NOTE — Therapy (Signed)
OUTPATIENT PHYSICAL THERAPY SOZO SCREENING NOTE   Patient Name: Danielle Camacho MRN: [id number] DOB:October 03, 1965, 56 y.o., female Today's Date: 05/02/2022  PCP: Ronita Hipps, MD REFERRING PROVIDER: Rolm Bookbinder, MD   PT End of Session - 05/02/22 1610     Visit Number 2   # unchanged due to screen only   PT Start Time 1608    PT Stop Time 1620    PT Time Calculation (min) 12 min    Activity Tolerance Patient tolerated treatment well    Behavior During Therapy Danielle Camacho for tasks assessed/performed             Past Medical History:  Diagnosis Date   Cancer ([place])    DCIS-left side Breast Cancer   Depression    Past Surgical History:  Procedure Laterality Date   ABDOMINAL HYSTERECTOMY     BREAST RECONSTRUCTION WITH PLACEMENT OF TISSUE EXPANDER AND ALLODERM Bilateral 11/26/2020   Procedure: BILATERAL BREAST RECONSTRUCTION WITH PLACEMENT OF TISSUE EXPANDER AND ALLODERM;  Surgeon: Irene Limbo, MD;  Location: [place];  Service: Plastics;  Laterality: Bilateral;   CHOLECYSTECTOMY     COLONOSCOPY  10/09/2017   Mild sigmoid diverticulosis. Otherwise normal colonoscopy   MASTECTOMY W/ SENTINEL NODE BIOPSY Left 11/26/2020   Procedure: LEFT MASTECTOMY WITH LEFT AXILLARY SENTINEL LYMPH NODE BIOPSY;  Surgeon: Rolm Bookbinder, MD;  Location: [place];  Service: General;  Laterality: Left;   REMOVAL OF BILATERAL TISSUE EXPANDERS WITH PLACEMENT OF BILATERAL BREAST IMPLANTS Bilateral 02/19/2021   Procedure: REMOVAL OF BILATERAL TISSUE EXPANDERS WITH PLACEMENT OF BILATERAL BREAST SILICONE IMPLANTS;  Surgeon: Irene Limbo, MD;  Location: [place];  Service: Plastics;  Laterality: Bilateral;   TONSILLECTOMY     TOTAL MASTECTOMY Right 11/26/2020   Procedure: RIGHT RISK REDUCING MASTECTOMY;  Surgeon: Rolm Bookbinder, MD;  Location: [place];  Service: General;  Laterality: Right;   Patient Active Problem List   Diagnosis Date Noted   Breast neoplasm, Tis (DCIS),  left 11/27/2020    REFERRING DIAG: left breast cancer at risk for lymphedema  THERAPY DIAG: Aftercare following surgery for neoplasm  PERTINENT HISTORY: 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   PAIN:  Are you having pain? No  SOZO SCREENING: Patient was assessed today using the SOZO machine to determine the lymphedema index score. This was compared to her baseline score. It was determined that she is within the recommended range when compared to her baseline and no further action is needed at this time. She will continue SOZO screenings. These are done every 3 months for 2 years post operatively followed by every 6 months for 2 years, and then annually.  Pt briefly saw Danielle Camacho, PT after SOZO to ask questions about cording and it was determined she will benefit from beginning physical therapy so appt was scheduled for this and request for referral sent.     L-DEX FLOWSHEETS - 05/02/22 1600       L-DEX LYMPHEDEMA SCREENING   Measurement Type Unilateral    L-DEX MEASUREMENT EXTREMITY Upper Extremity    POSITION  Standing    DOMINANT SIDE Right    At Risk Side Left    BASELINE SCORE (UNILATERAL) -1.6  L-DEX SCORE (UNILATERAL) -0.3    VALUE CHANGE (UNILAT) 1.3              Otelia Limes, PTA 05/02/2022, 4:31 PM

## 2022-05-11 ENCOUNTER — Encounter: Payer: Self-pay | Admitting: Physical Therapy

## 2022-05-11 ENCOUNTER — Other Ambulatory Visit: Payer: Self-pay

## 2022-05-11 ENCOUNTER — Ambulatory Visit: Payer: Commercial Managed Care - PPO | Admitting: Physical Therapy

## 2022-05-11 DIAGNOSIS — Z483 Aftercare following surgery for neoplasm: Secondary | ICD-10-CM

## 2022-05-11 DIAGNOSIS — D0512 Intraductal carcinoma in situ of left breast: Secondary | ICD-10-CM

## 2022-05-11 DIAGNOSIS — M79622 Pain in left upper arm: Secondary | ICD-10-CM | POA: Diagnosis present

## 2022-05-11 NOTE — Therapy (Signed)
OUTPATIENT PHYSICAL THERAPY ONCOLOGY EVALUATION  Patient Name: Danielle Camacho MRN: [id number] DOB:[birth year], 56 y.o., female Today's Date: 05/11/2022   PT End of Session - 05/11/22 1651     Visit Number 1    Number of Visits 5    Date for PT Re-Evaluation 06/08/22    PT Start Time 1606    PT Stop Time 1650    PT Time Calculation (min) 44 min    Activity Tolerance Patient tolerated treatment well    Behavior During Therapy [facility] for tasks assessed/performed             Past Medical History:  Diagnosis Date   Cancer ([place])    DCIS-left side Breast Cancer   Depression    Past Surgical History:  Procedure Laterality Date   ABDOMINAL HYSTERECTOMY     BREAST RECONSTRUCTION WITH PLACEMENT OF TISSUE EXPANDER AND ALLODERM Bilateral 11/26/2020   Procedure: BILATERAL BREAST RECONSTRUCTION WITH PLACEMENT OF TISSUE EXPANDER AND ALLODERM;  Surgeon: Irene Limbo, MD;  Location: [place];  Service: Plastics;  Laterality: Bilateral;   CHOLECYSTECTOMY     COLONOSCOPY  10/09/2017   Mild sigmoid diverticulosis. Otherwise normal colonoscopy   MASTECTOMY W/ SENTINEL NODE BIOPSY Left 11/26/2020   Procedure: LEFT MASTECTOMY WITH LEFT AXILLARY SENTINEL LYMPH NODE BIOPSY;  Surgeon: Rolm Bookbinder, MD;  Location: [place];  Service: General;  Laterality: Left;   REMOVAL OF BILATERAL TISSUE EXPANDERS WITH PLACEMENT OF BILATERAL BREAST IMPLANTS Bilateral 02/19/2021   Procedure: REMOVAL OF BILATERAL TISSUE EXPANDERS WITH PLACEMENT OF BILATERAL BREAST SILICONE IMPLANTS;  Surgeon: Irene Limbo, MD;  Location: [place];  Service: Plastics;  Laterality: Bilateral;   TONSILLECTOMY     TOTAL MASTECTOMY Right 11/26/2020   Procedure: RIGHT RISK REDUCING MASTECTOMY;  Surgeon: Rolm Bookbinder, MD;  Location: [place];  Service: General;  Laterality: Right;   Patient Active Problem List   Diagnosis Date Noted   Breast neoplasm, Tis (DCIS), left 11/27/2020    PCP: Ronita Hipps,  MD  REFERRING PROVIDER: Rolm Bookbinder, MD   REFERRING DIAG: D05.10 (ICD-10-CM) - Intraductal carcinoma in situ of unspecified breast   THERAPY DIAG:  Aftercare following surgery for neoplasm  Pain in left upper arm  Intraductal carcinoma in situ of left breast  ONSET DATE: 11/26/20  Rationale for Evaluation and Treatment Rehabilitation  SUBJECTIVE                                                                                                                                                                                           SUBJECTIVE STATEMENT: - cording  PERTINENT HISTORY:  Diagnosed with left intraductal carcinoma  in Situ Left Breast.  Pt underwent bilateral mastecomties with left sentinel node biopsy (0/3) with prepectoral tissue expanders on 11/26/2020. 02/20/21- pt had implants placed. 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 No  PRECAUTIONS: Other: at risk of lymphedema  WEIGHT BEARING RESTRICTIONS No  FALLS:  Has patient fallen in last 6 months? 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  LEISURE: 3-4x/wk, fit camp, HIIT, run  HAND DOMINANCE : right   PRIOR LEVEL OF FUNCTION: Independent  PATIENT GOALS to get rid of cording   OBJECTIVE  COGNITION:  Overall cognitive status: Within functional limits for tasks assessed   PALPATION: Numerous cords palpable in L axilla extending from breast to upper arm  OBSERVATIONS / OTHER ASSESSMENTS: cording visible at end range of abduction  POSTURE: forward head  UPPER EXTREMITY AROM/PROM: Full bilaterally   LYMPHEDEMA ASSESSMENTS:   SURGERY TYPE/DATE: 11/26/2020 Bilateral mastectomies with L SLNB  NUMBER OF LYMPH NODES REMOVED: 0/3  CHEMOTHERAPY:  none  RADIATION:none  HORMONE TREATMENT: none  INFECTIONS: none    QUICK DASH SURVEY:   Danielle Camacho - 05/11/22 0001     Open a tight or new jar No difficulty    Do heavy household chores (wash walls, wash floors) No difficulty    Carry a shopping bag or briefcase No difficulty    Wash your back No difficulty    Use a knife to cut food No difficulty    Recreational activities in which you take some force or impact through your arm, shoulder, or hand (golf, hammering, tennis) Mild difficulty    During the past week, to what extent has your arm, shoulder or hand problem interfered with your normal social activities with family, friends, neighbors, or groups? Slightly    During the past week, to what extent has your arm, shoulder or hand problem limited your work or other regular daily activities Not at all    Arm, shoulder, or hand pain. Mild    Tingling (pins and needles) in your arm, shoulder, or hand None    Difficulty Sleeping No difficulty    DASH Score 6.82 %               TODAY'S TREATMENT  05/11/22: MFR to cording in L axilla extending from breast through upper arm with numerous cords palpable that become less visible by end of session  PATIENT EDUCATION:  Education details: axillary cording , how to progress exercises while decreasing lymphedema risk Person educated: Patient Education method: Explanation Education comprehension: verbalized understanding   HOME EXERCISE PROGRAM: End range stretching with massage to help decrease cording  ASSESSMENT:  CLINICAL IMPRESSION: Patient is a 56 y.o. female who was seen today for physical therapy evaluation and treatment for L axillary cording. Pt's bilateral shoulder ROM Is WFL. She reports she has had axillary cording since her surgery. It does not limit her ROM but does cause discomfort during exercise. Pt feels like it almost gets "stuck" and she has to adjust her shoulder to get over the cord. Pt would benefit from  skilled PT services to decrease L axillary cording and improve comfort.     OBJECTIVE IMPAIRMENTS increased fascial restrictions.   ACTIVITY LIMITATIONS  difficulty with certain exercises at the gym  PARTICIPATION  LIMITATIONS:  none  PERSONAL FACTORS  none  are also affecting patient's functional outcome.   REHAB POTENTIAL: Excellent  CLINICAL DECISION MAKING: Stable/uncomplicated  EVALUATION COMPLEXITY: Low  GOALS: Goals reviewed with patient? Yes  SHORT TERM GOALS=LONG TERM GOALS Target date: 06/08/2022    Pt will not feel any limitations at the gym from cording to allow improved comfort.  Baseline: Goal status: INITIAL  2.  Pt will be independent with a home exercise program for stretching to decrease cording.  Baseline:  Goal status: INITIAL   PLAN: PT FREQUENCY: 1x/week  PT DURATION: 4 weeks  PLANNED INTERVENTIONS: Therapeutic exercises, Therapeutic activity, Patient/Family education, Self Care, Manual lymph drainage, and Manual therapy  PLAN FOR NEXT SESSION: MFR to cording in L axilla   Allyson Sabal Blue, PT 05/11/2022, 4:53 PM

## 2022-06-01 ENCOUNTER — Ambulatory Visit: Payer: Commercial Managed Care - PPO | Attending: General Surgery | Admitting: Physical Therapy

## 2022-06-01 ENCOUNTER — Encounter: Payer: Self-pay | Admitting: Physical Therapy

## 2022-06-01 DIAGNOSIS — Z483 Aftercare following surgery for neoplasm: Secondary | ICD-10-CM | POA: Diagnosis present

## 2022-06-01 DIAGNOSIS — D0512 Intraductal carcinoma in situ of left breast: Secondary | ICD-10-CM | POA: Diagnosis present

## 2022-06-01 DIAGNOSIS — M79622 Pain in left upper arm: Secondary | ICD-10-CM | POA: Insufficient documentation

## 2022-06-01 NOTE — Therapy (Signed)
OUTPATIENT PHYSICAL THERAPY ONCOLOGY TREATMENT  Patient Name: Danielle Camacho MRN: [id number] DOB:1965/10/11, 56 y.o., female Today's Date: 06/01/2022   PT End of Session - 06/01/22 1506     Visit Number 2    Number of Visits 5    Date for PT Re-Evaluation 06/08/22    PT Start Time 1505    PT Stop Time 1550    PT Time Calculation (min) 45 min    Activity Tolerance Patient tolerated treatment well    Behavior During Therapy [facility] for tasks assessed/performed             Past Medical History:  Diagnosis Date   Cancer ([place])    DCIS-left side Breast Cancer   Depression    Past Surgical History:  Procedure Laterality Date   ABDOMINAL HYSTERECTOMY     BREAST RECONSTRUCTION WITH PLACEMENT OF TISSUE EXPANDER AND ALLODERM Bilateral 11/26/2020   Procedure: BILATERAL BREAST RECONSTRUCTION WITH PLACEMENT OF TISSUE EXPANDER AND ALLODERM;  Surgeon: Irene Limbo, MD;  Location: [place];  Service: Plastics;  Laterality: Bilateral;   CHOLECYSTECTOMY     COLONOSCOPY  10/09/2017   Mild sigmoid diverticulosis. Otherwise normal colonoscopy   MASTECTOMY W/ SENTINEL NODE BIOPSY Left 11/26/2020   Procedure: LEFT MASTECTOMY WITH LEFT AXILLARY SENTINEL LYMPH NODE BIOPSY;  Surgeon: Rolm Bookbinder, MD;  Location: [place];  Service: General;  Laterality: Left;   REMOVAL OF BILATERAL TISSUE EXPANDERS WITH PLACEMENT OF BILATERAL BREAST IMPLANTS Bilateral 02/19/2021   Procedure: REMOVAL OF BILATERAL TISSUE EXPANDERS WITH PLACEMENT OF BILATERAL BREAST SILICONE IMPLANTS;  Surgeon: Irene Limbo, MD;  Location: [place];  Service: Plastics;  Laterality: Bilateral;   TONSILLECTOMY     TOTAL MASTECTOMY Right 11/26/2020   Procedure: RIGHT RISK REDUCING MASTECTOMY;  Surgeon: Rolm Bookbinder, MD;  Location: [place];  Service: General;  Laterality: Right;   Patient Active Problem List   Diagnosis Date Noted   Breast neoplasm, Tis (DCIS), left 11/27/2020    PCP: Ronita Hipps,  MD  REFERRING PROVIDER: Rolm Bookbinder, MD   REFERRING DIAG: D05.10 (ICD-10-CM) - Intraductal carcinoma in situ of unspecified breast   THERAPY DIAG:  Aftercare following surgery for neoplasm  Pain in left upper arm  Intraductal carcinoma in situ of left breast  ONSET DATE: 11/26/20  Rationale for Evaluation and Treatment Rehabilitation  SUBJECTIVE                                                                                                                                                                                           SUBJECTIVE STATEMENT: -  I could tell that my arm felt more loose  after last session but it is back.   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  Pt underwent bilateral mastecomties with left sentinel node biopsy (0/3) with prepectoral tissue expanders on 11/26/2020. 02/20/21- pt had implants placed. 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 No  PRECAUTIONS: Other: at risk of lymphedema  WEIGHT BEARING RESTRICTIONS No  FALLS:  Has patient fallen in last 6 months? 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  LEISURE: 3-4x/wk, fit camp, HIIT, run  HAND DOMINANCE : right   PRIOR LEVEL OF FUNCTION: Independent  PATIENT GOALS to get rid of cording   OBJECTIVE  COGNITION:  Overall cognitive status: Within functional limits for tasks assessed   PALPATION: Numerous cords palpable in L axilla extending from breast to upper arm  OBSERVATIONS / OTHER ASSESSMENTS: cording visible at end range of abduction  POSTURE: forward head  UPPER EXTREMITY AROM/PROM: Full bilaterally   LYMPHEDEMA ASSESSMENTS:   SURGERY TYPE/DATE: 11/26/2020 Bilateral mastectomies with L  SLNB  NUMBER OF LYMPH NODES REMOVED: 0/3  CHEMOTHERAPY: none  RADIATION:none  HORMONE TREATMENT: none  INFECTIONS: none    QUICK DASH SURVEY:       TODAY'S TREATMENT   06/01/22: MFR to cording in L axilla extending from breast through upper arm with numerous cords palpable that become less visible by end of session  05/11/22: MFR to cording in L axilla extending from breast through upper arm with numerous cords palpable that become less visible by end of session  PATIENT EDUCATION:  Education details: axillary cording , how to progress exercises while decreasing lymphedema risk Person educated: Patient Education method: Explanation Education comprehension: verbalized understanding   HOME EXERCISE PROGRAM: End range stretching with massage to help decrease cording  ASSESSMENT:  CLINICAL IMPRESSION: Pt reports that she felt less tight after last session but the cording has returned. At least 5 cords palpable today with one extending down towards lateral breast. Continued myofascial release to cording in L axilla with cording much less visible by end of session.    OBJECTIVE IMPAIRMENTS increased fascial restrictions.   ACTIVITY LIMITATIONS  difficulty with certain exercises at the gym  PARTICIPATION LIMITATIONS:  none  PERSONAL FACTORS  none  are also affecting patient's functional outcome.   REHAB POTENTIAL: Excellent  CLINICAL DECISION MAKING: Stable/uncomplicated  EVALUATION COMPLEXITY: Low  GOALS: Goals reviewed with patient? Yes  SHORT TERM GOALS=LONG TERM GOALS Target date: 06/08/2022    Pt will not feel any limitations at the gym from cording to allow improved comfort.  Baseline: Goal status: INITIAL  2.  Pt will be independent with a home exercise program for stretching to decrease cording.  Baseline:  Goal status: INITIAL   PLAN: PT FREQUENCY: 1x/week  PT DURATION: 4 weeks  PLANNED INTERVENTIONS: Therapeutic exercises, Therapeutic  activity, Patient/Family education, Self Care, Manual lymph drainage, and Manual therapy  PLAN FOR NEXT SESSION: MFR to cording in L axilla   Vineta Carone Breedlove Blue, PT 06/01/2022, 3:50 PM

## 2022-06-08 ENCOUNTER — Ambulatory Visit: Payer: Commercial Managed Care - PPO | Admitting: Rehabilitation

## 2022-06-15 ENCOUNTER — Ambulatory Visit: Payer: Commercial Managed Care - PPO | Admitting: Rehabilitation

## 2022-06-22 ENCOUNTER — Ambulatory Visit: Payer: Commercial Managed Care - PPO | Attending: General Surgery | Admitting: Rehabilitation

## 2022-06-22 DIAGNOSIS — D0512 Intraductal carcinoma in situ of left breast: Secondary | ICD-10-CM | POA: Diagnosis not present

## 2022-06-22 NOTE — Therapy (Signed)
OUTPATIENT PHYSICAL THERAPY ONCOLOGY TREATMENT  Patient Name: Danielle Camacho MRN: [id number] DOB:[birth year], 56 y.o., female Today's Date: 06/22/2022     Past Medical History:  Diagnosis Date   Cancer [facility])    DCIS-left side Breast Cancer   Depression    Past Surgical History:  Procedure Laterality Date   ABDOMINAL HYSTERECTOMY     BREAST RECONSTRUCTION WITH PLACEMENT OF TISSUE EXPANDER AND ALLODERM Bilateral 11/26/2020   Procedure: BILATERAL BREAST RECONSTRUCTION WITH PLACEMENT OF TISSUE EXPANDER AND ALLODERM;  Surgeon: Irene Limbo, MD;  Location: [place];  Service: Plastics;  Laterality: Bilateral;   CHOLECYSTECTOMY     COLONOSCOPY  10/09/2017   Mild sigmoid diverticulosis. Otherwise normal colonoscopy   MASTECTOMY W/ SENTINEL NODE BIOPSY Left 11/26/2020   Procedure: LEFT MASTECTOMY WITH LEFT AXILLARY SENTINEL LYMPH NODE BIOPSY;  Surgeon: Rolm Bookbinder, MD;  Location: [place];  Service: General;  Laterality: Left;   REMOVAL OF BILATERAL TISSUE EXPANDERS WITH PLACEMENT OF BILATERAL BREAST IMPLANTS Bilateral 02/19/2021   Procedure: REMOVAL OF BILATERAL TISSUE EXPANDERS WITH PLACEMENT OF BILATERAL BREAST SILICONE IMPLANTS;  Surgeon: Irene Limbo, MD;  Location: [place];  Service: Plastics;  Laterality: Bilateral;   TONSILLECTOMY     TOTAL MASTECTOMY Right 11/26/2020   Procedure: RIGHT RISK REDUCING MASTECTOMY;  Surgeon: Rolm Bookbinder, MD;  Location: [place];  Service: General;  Laterality: Right;   Patient Active Problem List   Diagnosis Date Noted   Breast neoplasm, Tis (DCIS), left 11/27/2020    PCP: Ronita Hipps, MD  REFERRING PROVIDER: Rolm Bookbinder, MD   REFERRING DIAG: D05.10 (ICD-10-CM) - 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  ONSET DATE: 11/26/20  Rationale for Evaluation and Treatment Rehabilitation  SUBJECTIVE                                                                                                                                                                                            SUBJECTIVE STATEMENT: -  She states its feeling better she is just feels tight at end ranges of activities.   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  Pt underwent bilateral mastecomties with left sentinel node biopsy (0/3) with prepectoral tissue expanders on 11/26/2020. 02/20/21- pt had implants placed. 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 No  PRECAUTIONS: Other: at risk of lymphedema  WEIGHT BEARING RESTRICTIONS No  FALLS:  Has patient fallen in last 6 months? 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  LEISURE: 3-4x/wk, fit camp, HIIT, run  HAND DOMINANCE : right   PRIOR LEVEL OF FUNCTION: Independent  PATIENT GOALS to get rid of cording   OBJECTIVE  COGNITION:  Overall cognitive status: Within functional limits for tasks assessed   PALPATION: Numerous cords palpable in L axilla extending from breast to upper arm  OBSERVATIONS / OTHER ASSESSMENTS: cording visible at end range of abduction  POSTURE: forward head  UPPER EXTREMITY AROM/PROM: Full bilaterally   LYMPHEDEMA ASSESSMENTS:   SURGERY TYPE/DATE: 11/26/2020 Bilateral mastectomies with L SLNB  NUMBER OF LYMPH NODES REMOVED: 0/3  CHEMOTHERAPY: none  RADIATION:none  HORMONE TREATMENT: none  INFECTIONS: none    QUICK DASH SURVEY:       TODAY'S TREATMENT   06/22/2022: MFR to cording in L axilla extending from breast through upper arm with numerous cords palpable that become less visible by end of session   06/01/22: MFR to cording in L axilla extending from breast through upper arm with numerous  cords palpable that become less visible by end of session  05/11/22: MFR to cording in L axilla extending from breast through upper arm with numerous cords palpable that become less visible by end of session  PATIENT EDUCATION:  Education details: axillary cording , how to progress exercises while decreasing lymphedema risk Person educated: Patient Education method: Explanation Education comprehension: verbalized understanding   HOME EXERCISE PROGRAM: End range stretching with massage to help decrease cording  ASSESSMENT:  CLINICAL IMPRESSION: Pt reports that she has notice a difference in her ROM and it is less stiff then last time. Cords palpable today with one extending down towards lateral breast. Continued myofascial release to cording in L axilla with cording much less visible by end of session.    OBJECTIVE IMPAIRMENTS increased fascial restrictions.   ACTIVITY LIMITATIONS  difficulty with certain exercises at the gym  PARTICIPATION LIMITATIONS:  none  PERSONAL FACTORS  none  are also affecting patient's functional outcome.   REHAB POTENTIAL: Excellent  CLINICAL DECISION MAKING: Stable/uncomplicated  EVALUATION COMPLEXITY: Low  GOALS: Goals reviewed with patient? Yes  SHORT TERM GOALS=LONG TERM GOALS Target date: 06/08/2022    Pt will not feel any limitations at the gym from cording to allow improved comfort.  Baseline: Goal status: INITIAL  2.  Pt will be independent with a home exercise program for stretching to decrease cording.  Baseline:  Goal status: INITIAL   PLAN: PT FREQUENCY: 1x/week  PT DURATION: 4 weeks  PLANNED INTERVENTIONS: Therapeutic exercises, Therapeutic activity, Patient/Family education, Self Care, Manual lymph drainage, and Manual therapy  PLAN FOR NEXT SESSION: MFR to cording in L axilla add gentle stretching     Vinisha Faxon, Student-PT 06/22/2022  4:48 PM

## 2022-08-08 ENCOUNTER — Ambulatory Visit: Payer: Commercial Managed Care - PPO | Attending: General Surgery

## 2022-08-08 VITALS — Wt 120.5 lb

## 2022-08-08 DIAGNOSIS — Z483 Aftercare following surgery for neoplasm: Secondary | ICD-10-CM | POA: Insufficient documentation

## 2022-08-08 NOTE — Therapy (Signed)
OUTPATIENT PHYSICAL THERAPY SOZO SCREENING NOTE   Patient Name: Danielle Camacho MRN: [id number] DOB:[birth year], 56 y.o., female Today's Date: 08/08/2022  PCP: Ronita Hipps, MD REFERRING PROVIDER: Rolm Bookbinder, MD   PT End of Session - 08/08/22 1701     Visit Number 3   # unchanged due to screen only   PT Start Time 1607    PT Stop Time 1704    PT Time Calculation (min) 6 min    Activity Tolerance Patient tolerated treatment well    Behavior During Therapy [facility] for tasks assessed/performed             Past Medical History:  Diagnosis Date   Cancer ([place])    DCIS-left side Breast Cancer   Depression    Past Surgical History:  Procedure Laterality Date   ABDOMINAL HYSTERECTOMY     BREAST RECONSTRUCTION WITH PLACEMENT OF TISSUE EXPANDER AND ALLODERM Bilateral 11/26/2020   Procedure: BILATERAL BREAST RECONSTRUCTION WITH PLACEMENT OF TISSUE EXPANDER AND ALLODERM;  Surgeon: Irene Limbo, MD;  Location: [place];  Service: Plastics;  Laterality: Bilateral;   CHOLECYSTECTOMY     COLONOSCOPY  10/09/2017   Mild sigmoid diverticulosis. Otherwise normal colonoscopy   MASTECTOMY W/ SENTINEL NODE BIOPSY Left 11/26/2020   Procedure: LEFT MASTECTOMY WITH LEFT AXILLARY SENTINEL LYMPH NODE BIOPSY;  Surgeon: Rolm Bookbinder, MD;  Location: [place];  Service: General;  Laterality: Left;   REMOVAL OF BILATERAL TISSUE EXPANDERS WITH PLACEMENT OF BILATERAL BREAST IMPLANTS Bilateral 02/19/2021   Procedure: REMOVAL OF BILATERAL TISSUE EXPANDERS WITH PLACEMENT OF BILATERAL BREAST SILICONE IMPLANTS;  Surgeon: Irene Limbo, MD;  Location: [place];  Service: Plastics;  Laterality: Bilateral;   TONSILLECTOMY     TOTAL MASTECTOMY Right 11/26/2020   Procedure: RIGHT RISK REDUCING MASTECTOMY;  Surgeon: Rolm Bookbinder, MD;  Location: [place];  Service: General;  Laterality: Right;   Patient Active Problem List   Diagnosis Date Noted   Breast neoplasm, Tis (DCIS),  left 11/27/2020    REFERRING DIAG: left breast cancer at risk for lymphedema  THERAPY DIAG: Aftercare following surgery for neoplasm  PERTINENT HISTORY: 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   PAIN:  Are you having pain? No  SOZO SCREENING: Patient was assessed today using the SOZO machine to determine the lymphedema index score. This was compared to her baseline score. It was determined that she is within the recommended range when compared to her baseline and no further action is needed at this time. She will continue SOZO screenings. These are done every 3 months for 2 years post operatively followed by every 6 months for 2 years, and then annually.  Pt briefly saw Leone Payor, PT after SOZO to ask questions about cording and it was determined she will benefit from beginning physical therapy so appt was scheduled for this and request for referral sent.     L-DEX FLOWSHEETS - 08/08/22 1700       L-DEX LYMPHEDEMA SCREENING   Measurement Type Unilateral    L-DEX MEASUREMENT EXTREMITY Upper Extremity    POSITION  Standing    DOMINANT SIDE Right    At Risk Side Left    BASELINE SCORE (UNILATERAL) -1.6  L-DEX SCORE (UNILATERAL) 1.6    VALUE CHANGE (UNILAT) 3.2              Otelia Limes, PTA 08/08/2022, 5:03 PM

## 2022-09-20 IMAGING — MR MR BREAST*R* WO/W CM
6 of 8 series · 34 of 48 positions shown · IV contrast (5ml Gadavist)
Comparison: Previous exam(s).

CLINICAL DATA: 54-year-old female presents for MR guided biopsies
of 2 areas of RIGHT breast enhancement. Recent diagnosis of DCIS
throughout the LEFT breast and the patient states she strongly
desires bilateral mastectomies.

LABS:  None performed today
EXAM:
MR OF THE RIGHT BREAST WITH AND WITHOUT CONTRAST
TECHNIQUE: Multiplanar, multisequence MR images of the right breast were
obtained prior to and following the intravenous administration of 5
ml of Gadavist.

[Series 4: fiducial unilateral · sagittal · 2.0mm · 1.33mm/px · 1 of 52 slices shown]
[im 1/52]
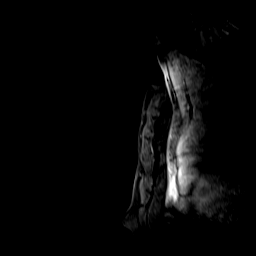

[Series 5: dynamic pre · axial · non-contrast · 1.3mm · 0.73mm/px · z∈[-68,+118]mm · 6 of 144 slices shown]
[im 1/144]
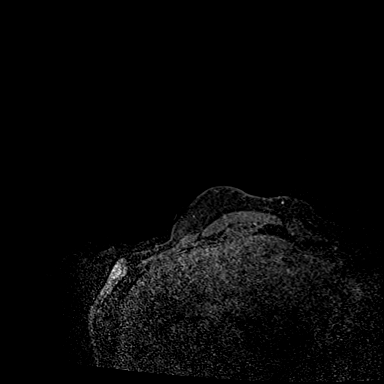
[im 29/144]
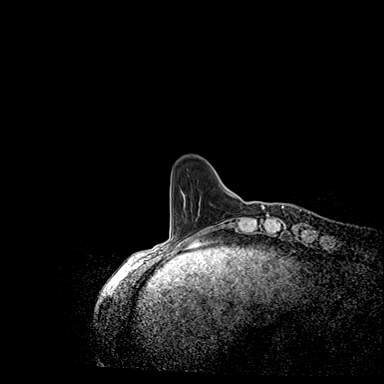
[im 58/144]
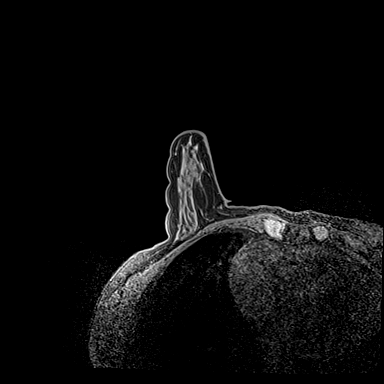
[im 86/144]
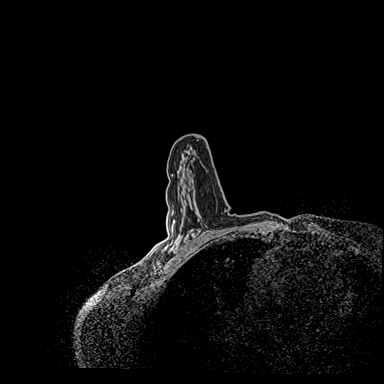
[im 115/144]
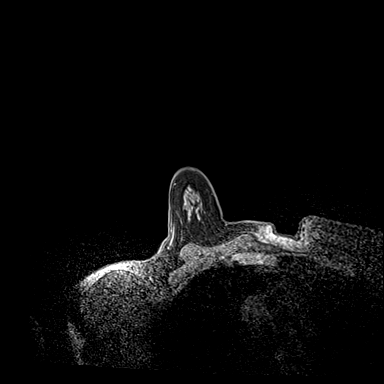
[im 144/144]
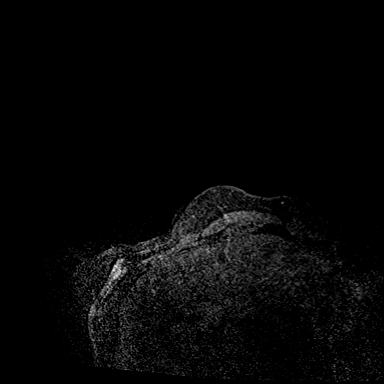

[Series 6: dynamic post 20 · axial · 1.3mm · 0.73mm/px · z∈[-68,+118]mm · 6 of 144 slices shown (1 of 2)]
[im 1/144]
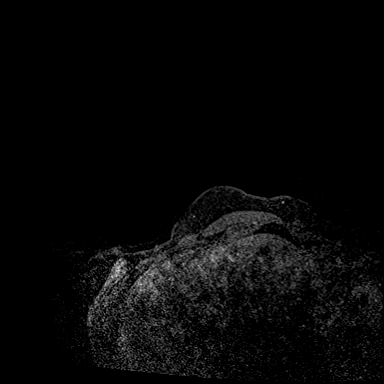
[im 29/144]
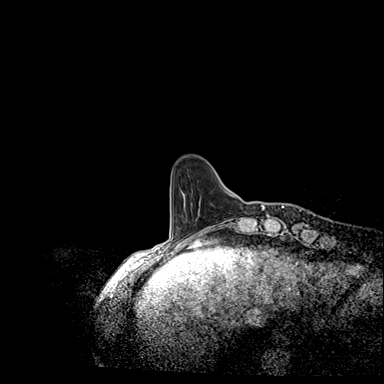
[im 58/144]
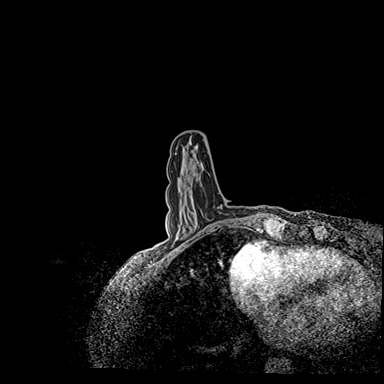
[im 86/144]
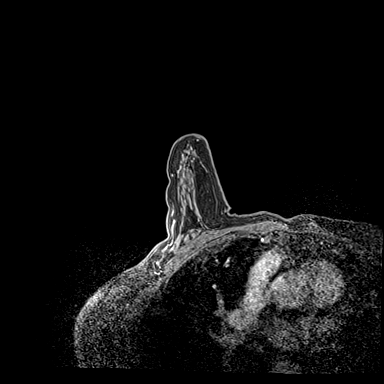
[im 115/144]
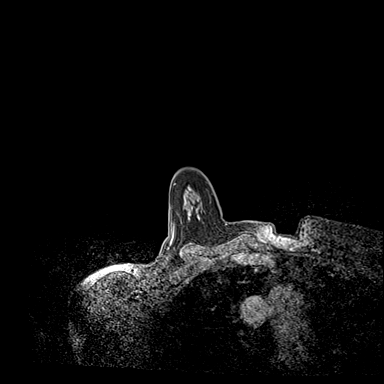
[im 144/144]
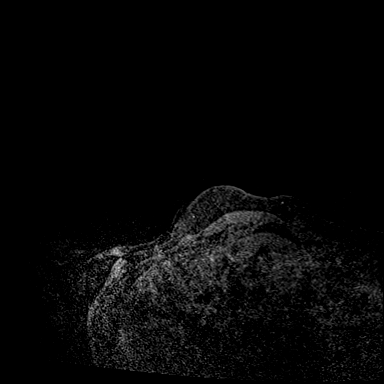

[Series 7: dynamic post 20 · axial · 1.3mm · 0.73mm/px · z∈[-68,+118]mm · 7 of 144 slices shown (2 of 2)]
[im 1/144]
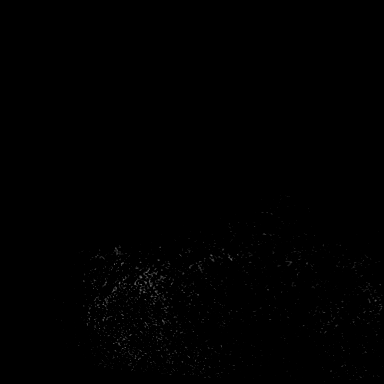
[im 24/144]
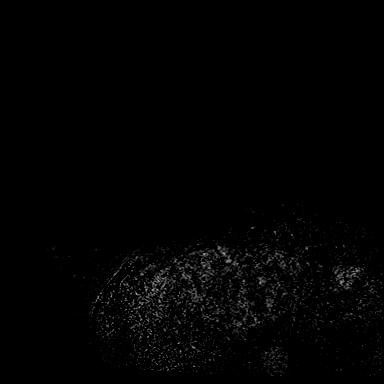
[im 48/144]
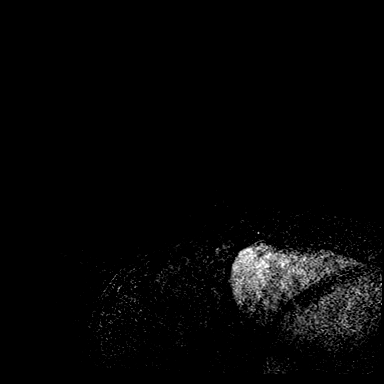
[im 72/144]
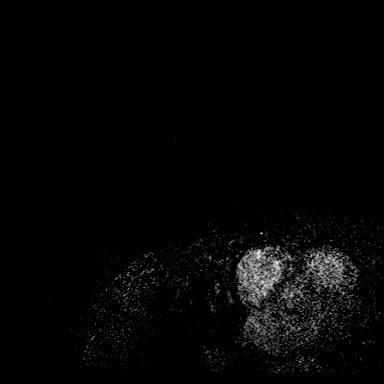
[im 96/144]
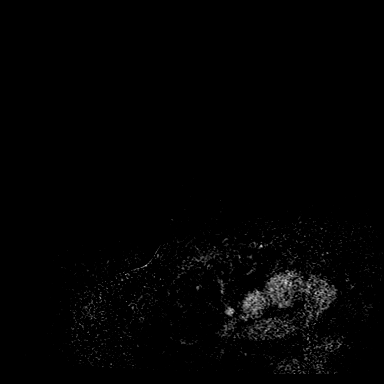
[im 120/144]
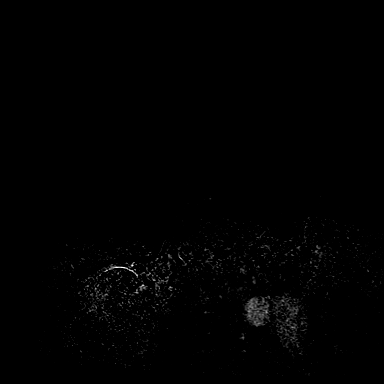
[im 144/144]
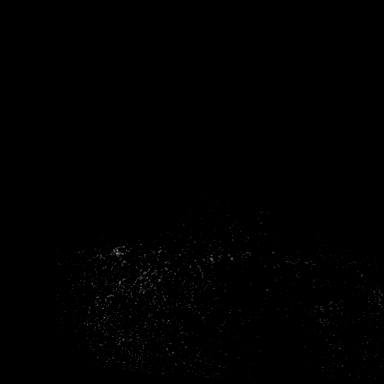

[Series 8: dynamic post 3 · axial · 1.3mm · 0.73mm/px · z∈[-68,+118]mm · 7 of 144 slices shown (1 of 2)]
[im 1/144]
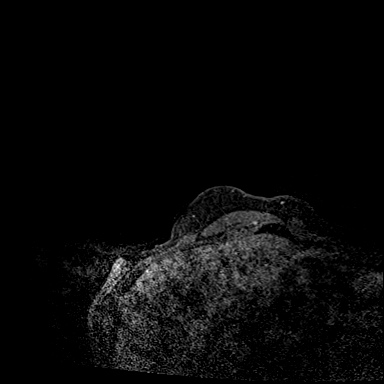
[im 24/144]
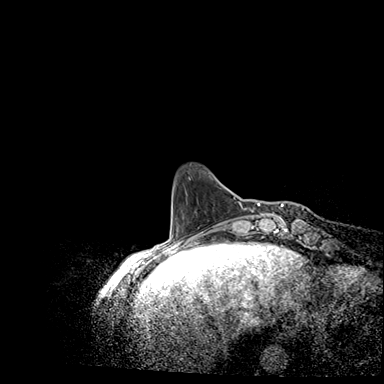
[im 48/144]
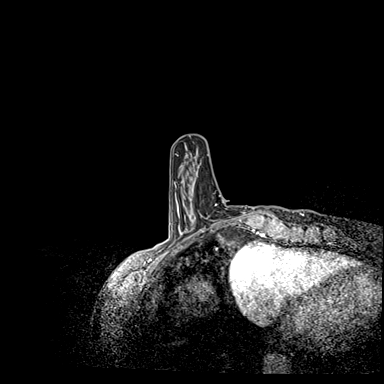
[im 72/144]
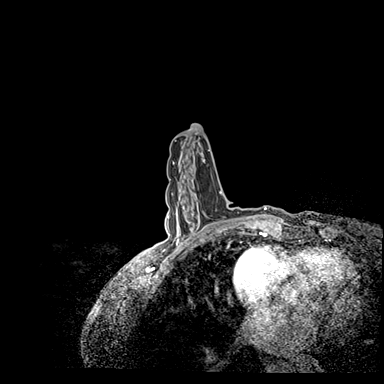
[im 96/144]
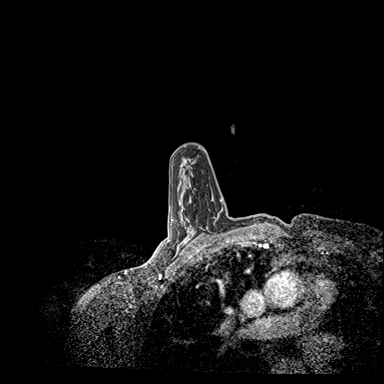
[im 120/144]
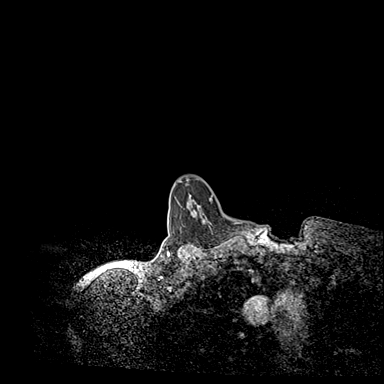
[im 144/144]
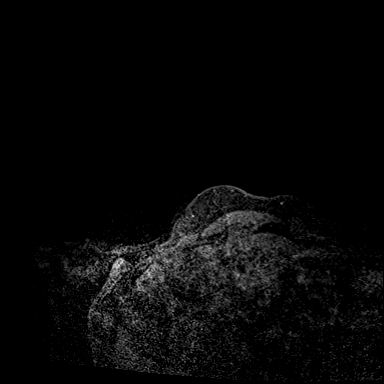

[Series 9: dynamic post 3 · axial · 1.3mm · 0.73mm/px · z∈[-68,+118]mm · 7 of 144 slices shown (2 of 2)]
[im 1/144]
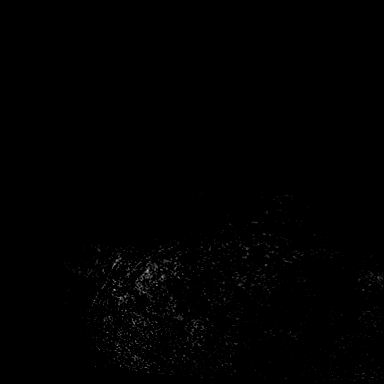
[im 24/144]
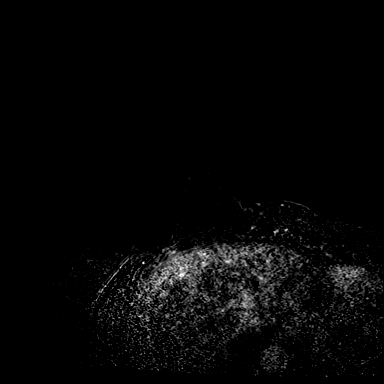
[im 48/144]
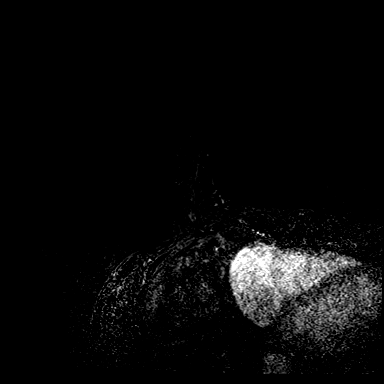
[im 72/144]
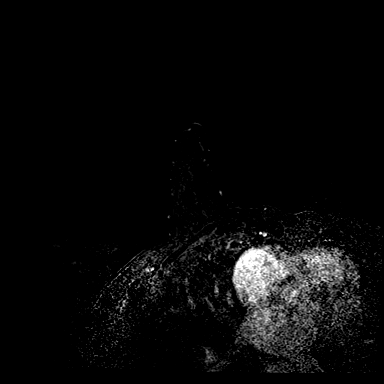
[im 96/144]
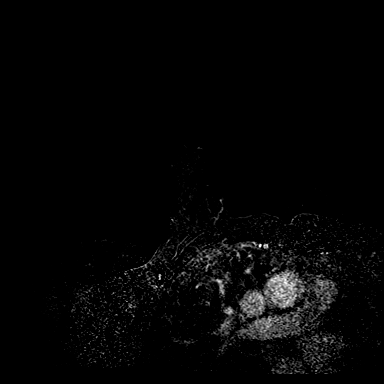
[im 120/144]
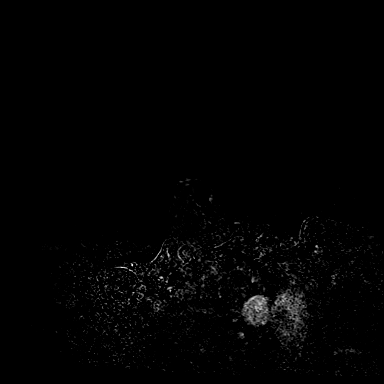
[im 144/144]
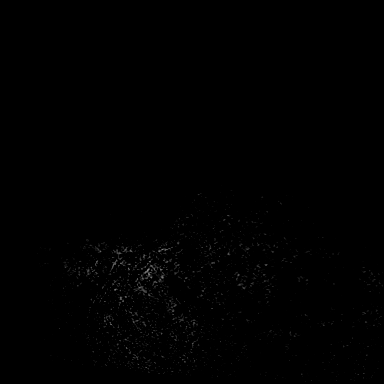

[34 of 48 positions shown; findings below may reference images not displayed]

Three-dimensional MR images were rendered by post-processing of the
original MR data on an independent workstation. The
three-dimensional MR images were interpreted, and findings are
reported in the following complete MRI report for this study. Three
dimensional images were evaluated at the independent [employer]
workstation
FINDINGS: Breast composition: c. Heterogeneous fibroglandular tissue.

Background parenchymal enhancement: Minimal

Right breast: Three subtle areas of persistent circumferential ring
like enhancement are noted within the RIGHT breast, 2 which were
previously identified. These areas of enhancement definitely
surround cysts identified on T2 images today. These areas are
located within the central, posterior central and posterior upper
outer RIGHT breast.

No suspicious areas of enhancement are identified within the RIGHT
breast for biopsy.

Lymph nodes: No abnormal appearing lymph nodes.

Ancillary findings:  None.
IMPRESSION: 1. Three subtle areas of persistent circumferential ring like
enhancement within the RIGHT breast, 2 previously identified. On
today's images and when compared to the prior study, these areas are
compatible with benign cystic changes. Therefore, tissue sampling
was not performed.

RECOMMENDATION:
Treatment plan for recently diagnosed LEFT breast cancer.

These findings were discussed with the patient.

BI-RADS CATEGORY  2: Benign.

## 2022-11-14 ENCOUNTER — Ambulatory Visit: Payer: Commercial Managed Care - PPO | Attending: General Surgery

## 2022-11-14 VITALS — Wt 122.1 lb

## 2022-11-14 DIAGNOSIS — Z483 Aftercare following surgery for neoplasm: Secondary | ICD-10-CM | POA: Insufficient documentation

## 2022-11-14 NOTE — Therapy (Signed)
OUTPATIENT PHYSICAL THERAPY SOZO SCREENING NOTE   Patient Name: Danielle Camacho MRN: [id number] DOB:1966/08/19, 57 y.o., female Today's Date: 11/14/2022  PCP: Ronita Hipps, MD REFERRING PROVIDER: Rolm Bookbinder, MD   PT End of Session - 11/14/22 1507     Visit Number 3   # unchanged due to screen only   PT Start Time 1504    PT Stop Time 1508    PT Time Calculation (min) 4 min    Activity Tolerance Patient tolerated treatment well    Behavior During Therapy [facility] for tasks assessed/performed             Past Medical History:  Diagnosis Date   Cancer ([place])    DCIS-left side Breast Cancer   Depression    Past Surgical History:  Procedure Laterality Date   ABDOMINAL HYSTERECTOMY     BREAST RECONSTRUCTION WITH PLACEMENT OF TISSUE EXPANDER AND ALLODERM Bilateral 11/26/2020   Procedure: BILATERAL BREAST RECONSTRUCTION WITH PLACEMENT OF TISSUE EXPANDER AND ALLODERM;  Surgeon: Irene Limbo, MD;  Location: [place];  Service: Plastics;  Laterality: Bilateral;   CHOLECYSTECTOMY     COLONOSCOPY  10/09/2017   Mild sigmoid diverticulosis. Otherwise normal colonoscopy   MASTECTOMY W/ SENTINEL NODE BIOPSY Left 11/26/2020   Procedure: LEFT MASTECTOMY WITH LEFT AXILLARY SENTINEL LYMPH NODE BIOPSY;  Surgeon: Rolm Bookbinder, MD;  Location: [place];  Service: General;  Laterality: Left;   REMOVAL OF BILATERAL TISSUE EXPANDERS WITH PLACEMENT OF BILATERAL BREAST IMPLANTS Bilateral 02/19/2021   Procedure: REMOVAL OF BILATERAL TISSUE EXPANDERS WITH PLACEMENT OF BILATERAL BREAST SILICONE IMPLANTS;  Surgeon: Irene Limbo, MD;  Location: [place];  Service: Plastics;  Laterality: Bilateral;   TONSILLECTOMY     TOTAL MASTECTOMY Right 11/26/2020   Procedure: RIGHT RISK REDUCING MASTECTOMY;  Surgeon: Rolm Bookbinder, MD;  Location: 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   PAIN:  Are you having pain? No  SOZO SCREENING: Patient was assessed today using the SOZO machine to determine the lymphedema index score. This was compared to her baseline score. It was determined that she is within the recommended range when compared to her baseline and no further action is needed at this time. She will continue SOZO screenings. These are done every 3 months for 2 years post operatively followed by every 6 months for 2 years, and then annually.  Pt briefly saw Leone Payor, PT after SOZO to ask questions about cording and it was determined she will benefit from beginning physical therapy so appt was scheduled for this and request for referral sent.     L-DEX FLOWSHEETS - 11/14/22 1500       L-DEX LYMPHEDEMA SCREENING   Measurement Type Unilateral    L-DEX MEASUREMENT EXTREMITY Upper Extremity    POSITION  Standing    DOMINANT SIDE Right    At Risk Side Left    BASELINE SCORE (UNILATERAL) -1.6  L-DEX SCORE (UNILATERAL) 1.6    VALUE CHANGE (UNILAT) 3.2                 Otelia Limes, PTA 11/14/2022, 3:10 PM

## 2023-02-13 ENCOUNTER — Ambulatory Visit: Payer: Commercial Managed Care - PPO | Attending: General Surgery

## 2023-02-20 NOTE — Therapy (Signed)
  OUTPATIENT PHYSICAL THERAPY SOZO SCREENING NOTE   Patient Name: Danielle Camacho MRN: [id number] DOB:1966-08-11, 57 y.o., female Today's Date: 02/21/2023  PCP: Marylen Ponto, MD REFERRING PROVIDER: Emelia Loron, MD   PT End of Session - 02/21/23 1558     Visit Number 3   screen only   Number of Visits 3    PT Start Time 1600    PT Stop Time 1607    PT Time Calculation (min) 7 min    Activity Tolerance Patient tolerated treatment well    Behavior During Therapy [facility] for tasks assessed/performed             Past Medical History:  Diagnosis Date   Cancer ([facility])    DCIS-left side Breast Cancer   Depression    Past Surgical History:  Procedure Laterality Date   ABDOMINAL HYSTERECTOMY     BREAST RECONSTRUCTION WITH PLACEMENT OF TISSUE EXPANDER AND ALLODERM Bilateral 11/26/2020   Procedure: BILATERAL BREAST RECONSTRUCTION WITH PLACEMENT OF TISSUE EXPANDER AND ALLODERM;  Surgeon: Glenna Fellows, MD;  [facility];  Service: Plastics;  Laterality: Bilateral;   CHOLECYSTECTOMY     COLONOSCOPY  10/09/2017   Mild sigmoid diverticulosis. Otherwise normal colonoscopy   MASTECTOMY W/ SENTINEL NODE BIOPSY Left 11/26/2020   Procedure: LEFT MASTECTOMY WITH LEFT AXILLARY SENTINEL LYMPH NODE BIOPSY;  Surgeon: Emelia Loron, MD;  [facility];  Service: General;  Laterality: Left;   REMOVAL OF BILATERAL TISSUE EXPANDERS WITH PLACEMENT OF BILATERAL BREAST IMPLANTS Bilateral 02/19/2021   Procedure: REMOVAL OF BILATERAL TISSUE EXPANDERS WITH PLACEMENT OF BILATERAL BREAST SILICONE IMPLANTS;  Surgeon: Glenna Fellows, MD;  Location: [facility];  Service: Plastics;  Laterality: Bilateral;   TONSILLECTOMY     TOTAL MASTECTOMY Right 11/26/2020   Procedure: RIGHT RISK REDUCING MASTECTOMY;  Surgeon: Emelia Loron, MD;  Location[facility];  Service: General;  Laterality: Right;   Patient Active Problem List   Diagnosis Date Noted   Breast neoplasm, Tis (DCIS),  left 11/27/2020    REFERRING DIAG: left breast cancer at risk for lymphedema  THERAPY DIAG:  Aftercare following surgery for neoplasm  Breast neoplasm, Tis (DCIS), left  PERTINENT HISTORY:  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  PAIN:  Are you having pain? No  SOZO SCREENING: Patient was assessed today using the SOZO machine to determine the lymphedema index score. This was compared to her baseline score. It was determined that she is within the recommended range when compared to her baseline and no further action is needed at this time. She will continue SOZO screenings. These are done every 3 months for 2 years post operatively followed by every 6 months for 2 years, and then annually. She is now on a 2 year plan   Waynette Buttery, PT 02/21/2023, 4:08 PM

## 2023-02-21 ENCOUNTER — Ambulatory Visit: Payer: Commercial Managed Care - PPO | Attending: General Surgery

## 2023-02-21 DIAGNOSIS — D0512 Intraductal carcinoma in situ of left breast: Secondary | ICD-10-CM | POA: Insufficient documentation

## 2023-02-21 DIAGNOSIS — Z483 Aftercare following surgery for neoplasm: Secondary | ICD-10-CM | POA: Insufficient documentation

## 2023-09-04 ENCOUNTER — Ambulatory Visit: Payer: Commercial Managed Care - PPO

## 2023-11-11 NOTE — Therapy (Signed)
  OUTPATIENT PHYSICAL THERAPY SOZO SCREENING NOTE   Patient Name: Danielle Camacho MRN: [id number] DOB:October 15, 1965, 58 y.o., female Today's Date: 11/13/2023  PCP: Marylen Ponto, MD REFERRING PROVIDER: Emelia Loron, MD   PT End of Session - 11/13/23 1450     Visit Number 3   number unchanged due to screen only   PT Start Time 1448    PT Stop Time 1452    PT Time Calculation (min) 4 min    Activity Tolerance Patient tolerated treatment well    Behavior During Therapy [facility] for tasks assessed/performed             Past Medical History:  Diagnosis Date   Cancer ([facility])    DCIS-left side Breast Cancer   Depression    Past Surgical History:  Procedure Laterality Date   ABDOMINAL HYSTERECTOMY     BREAST RECONSTRUCTION WITH PLACEMENT OF TISSUE EXPANDER AND ALLODERM Bilateral 11/26/2020   Procedure: BILATERAL BREAST RECONSTRUCTION WITH PLACEMENT OF TISSUE EXPANDER AND ALLODERM;  Surgeon: Glenna Fellows, MD;  [facility];  Service: Plastics;  Laterality: Bilateral;   CHOLECYSTECTOMY     COLONOSCOPY  10/09/2017   Mild sigmoid diverticulosis. Otherwise normal colonoscopy   MASTECTOMY W/ SENTINEL NODE BIOPSY Left 11/26/2020   Procedure: LEFT MASTECTOMY WITH LEFT AXILLARY SENTINEL LYMPH NODE BIOPSY;  Surgeon: Emelia Loron, MD;  [facility];  Service: General;  Laterality: Left;   REMOVAL OF BILATERAL TISSUE EXPANDERS WITH PLACEMENT OF BILATERAL BREAST IMPLANTS Bilateral 02/19/2021   Procedure: REMOVAL OF BILATERAL TISSUE EXPANDERS WITH PLACEMENT OF BILATERAL BREAST SILICONE IMPLANTS;  Surgeon: Glenna Fellows, MD;  Location: [facility];  Service: Plastics;  Laterality: Bilateral;   TONSILLECTOMY     TOTAL MASTECTOMY Right 11/26/2020   Procedure: RIGHT RISK REDUCING MASTECTOMY;  Surgeon: Emelia Loron, MD;  Location: [facility];  Service: General;  Laterality: Right;   Patient Active Problem List   Diagnosis Date Noted   Breast neoplasm, Tis (DCIS),  left 11/27/2020    REFERRING DIAG: left breast cancer at risk for lymphedema  THERAPY DIAG:  Aftercare following surgery for neoplasm  PERTINENT HISTORY:  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   SOZO SCREENING: Patient was assessed today using the SOZO machine to determine the lymphedema index score. This was compared to her baseline score. It was determined that she is within the recommended range when compared to her baseline and no further action is needed at this time. She will continue SOZO screenings. These are done every 3 months for 2 years post operatively followed by every 6 months for 2 years, and then annually.    L-DEX FLOWSHEETS - 11/13/23 1400       L-DEX LYMPHEDEMA SCREENING   Measurement Type Unilateral    L-DEX MEASUREMENT EXTREMITY Upper Extremity    POSITION  Standing    DOMINANT SIDE Right    At Risk Side Left    BASELINE SCORE (UNILATERAL) -1.6    L-DEX SCORE (UNILATERAL) -3.7    VALUE CHANGE (UNILAT) -2.1                 Waynette Buttery, PT 11/13/2023, 2:52 PM

## 2023-11-13 ENCOUNTER — Ambulatory Visit: Payer: Self-pay | Attending: General Surgery

## 2023-11-13 DIAGNOSIS — Z483 Aftercare following surgery for neoplasm: Secondary | ICD-10-CM | POA: Insufficient documentation

## 2024-05-13 ENCOUNTER — Ambulatory Visit: Attending: General Surgery

## 2024-05-13 VITALS — Wt 122.1 lb

## 2024-05-13 DIAGNOSIS — Z483 Aftercare following surgery for neoplasm: Secondary | ICD-10-CM | POA: Insufficient documentation

## 2024-05-13 NOTE — Therapy (Signed)
 OUTPATIENT PHYSICAL THERAPY SOZO SCREENING NOTE   Patient Name: Danielle Camacho MRN: [id number] DOB:09/21/1965, 58 y.o., female Today's Date: 05/13/2024  PCP: Ina Marcellus GORMAN, MD REFERRING PROVIDER: Ebbie Cough, MD   PT End of Session - 05/13/24 1504     Visit Number 3   # unchanged due to screen only   PT Start Time 1503    PT Stop Time 1507    PT Time Calculation (min) 4 min    Activity Tolerance Patient tolerated treatment well    Behavior During Therapy [facility] for tasks assessed/performed          Past Medical History:  Diagnosis Date   Cancer (HCC)    DCIS-left side Breast Cancer   Depression    Past Surgical History:  Procedure Laterality Date   ABDOMINAL HYSTERECTOMY     BREAST RECONSTRUCTION WITH PLACEMENT OF TISSUE EXPANDER AND ALLODERM Bilateral 11/26/2020   Procedure: BILATERAL BREAST RECONSTRUCTION WITH PLACEMENT OF TISSUE EXPANDER AND ALLODERM;  Surgeon: Arelia Filippo, MD;  Location: [facility] OR;  Service: Plastics;  Laterality: Bilateral;   CHOLECYSTECTOMY     COLONOSCOPY  10/09/2017   Mild sigmoid diverticulosis. Otherwise normal colonoscopy   MASTECTOMY W/ SENTINEL NODE BIOPSY Left 11/26/2020   Procedure: LEFT MASTECTOMY WITH LEFT AXILLARY SENTINEL LYMPH NODE BIOPSY;  Surgeon: Ebbie Cough, MD;  Location: [facility] OR;  Service: General;  Laterality: Left;   REMOVAL OF BILATERAL TISSUE EXPANDERS WITH PLACEMENT OF BILATERAL BREAST IMPLANTS Bilateral 02/19/2021   Procedure: REMOVAL OF BILATERAL TISSUE EXPANDERS WITH PLACEMENT OF BILATERAL BREAST SILICONE IMPLANTS;  Surgeon: Arelia Filippo, MD;  Location: [facility];  Service: Plastics;  Laterality: Bilateral;   TONSILLECTOMY     TOTAL MASTECTOMY Right 11/26/2020   Procedure: RIGHT RISK REDUCING MASTECTOMY;  Surgeon: Ebbie Cough, MD;  Location: [facility];  Service: General;  Laterality: Right;   Patient Active Problem List   Diagnosis Date Noted   Breast neoplasm, Tis (DCIS), left  11/27/2020    REFERRING DIAG: left breast cancer at risk for lymphedema  THERAPY DIAG:  Aftercare following surgery for neoplasm  PERTINENT HISTORY:  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  PAIN:  Are you having pain?   SOZO SCREENING: Patient was assessed today using the SOZO machine to determine the lymphedema index score. This was compared to her baseline score. It was determined that she is within the recommended range when compared to her baseline and no further action is needed at this time. She will continue SOZO screenings. These are done every 3 months for 2 years post operatively followed by every 6 months for 2 years, and then annually.     L-DEX FLOWSHEETS - 05/13/24 1500       L-DEX LYMPHEDEMA SCREENING   Measurement Type Unilateral    L-DEX MEASUREMENT EXTREMITY Upper Extremity    POSITION  Standing    DOMINANT SIDE Right    At Risk Side Left    BASELINE SCORE (UNILATERAL) -1.6    L-DEX SCORE (UNILATERAL) 2.6    VALUE CHANGE (UNILAT) 4.2          P: One more 6 month L-Dex then transition to annual.    Aden Berwyn Caldron, PTA  05/13/2024, 3:06 PM

## 2024-11-11 ENCOUNTER — Ambulatory Visit
